# Patient Record
Sex: Female | Born: 1935 | Race: White | Hispanic: No | Smoking: Former smoker
Health system: Southern US, Community
[De-identification: ages and names within clinical notes are randomized; demographics above are authoritative.]

## PROBLEM LIST (undated history)

## (undated) DIAGNOSIS — I4891 Unspecified atrial fibrillation: Secondary | ICD-10-CM

## (undated) DIAGNOSIS — I10 Essential (primary) hypertension: Secondary | ICD-10-CM

## (undated) DIAGNOSIS — F419 Anxiety disorder, unspecified: Secondary | ICD-10-CM

## (undated) DIAGNOSIS — K2970 Gastritis, unspecified, without bleeding: Secondary | ICD-10-CM

## (undated) DIAGNOSIS — J45909 Unspecified asthma, uncomplicated: Secondary | ICD-10-CM

## (undated) DIAGNOSIS — M1990 Unspecified osteoarthritis, unspecified site: Secondary | ICD-10-CM

## (undated) HISTORY — PX: ABDOMINAL HYSTERECTOMY: SHX81

---

## 2008-01-23 ENCOUNTER — Ambulatory Visit: Payer: Self-pay | Admitting: Unknown Physician Specialty

## 2008-04-19 ENCOUNTER — Emergency Department: Payer: Self-pay | Admitting: Emergency Medicine

## 2013-08-01 ENCOUNTER — Emergency Department: Payer: Self-pay | Admitting: Emergency Medicine

## 2014-06-12 ENCOUNTER — Emergency Department: Payer: Self-pay | Admitting: Emergency Medicine

## 2016-09-27 DIAGNOSIS — I10 Essential (primary) hypertension: Secondary | ICD-10-CM | POA: Diagnosis not present

## 2016-09-27 DIAGNOSIS — M159 Polyosteoarthritis, unspecified: Secondary | ICD-10-CM | POA: Diagnosis not present

## 2016-09-27 DIAGNOSIS — H1011 Acute atopic conjunctivitis, right eye: Secondary | ICD-10-CM | POA: Diagnosis not present

## 2016-09-27 DIAGNOSIS — J438 Other emphysema: Secondary | ICD-10-CM | POA: Diagnosis not present

## 2016-09-27 DIAGNOSIS — K219 Gastro-esophageal reflux disease without esophagitis: Secondary | ICD-10-CM | POA: Diagnosis not present

## 2016-09-27 DIAGNOSIS — F39 Unspecified mood [affective] disorder: Secondary | ICD-10-CM | POA: Diagnosis not present

## 2016-10-16 DIAGNOSIS — M159 Polyosteoarthritis, unspecified: Secondary | ICD-10-CM

## 2016-10-16 DIAGNOSIS — J438 Other emphysema: Secondary | ICD-10-CM | POA: Diagnosis not present

## 2016-10-16 DIAGNOSIS — K219 Gastro-esophageal reflux disease without esophagitis: Secondary | ICD-10-CM

## 2016-10-16 DIAGNOSIS — F39 Unspecified mood [affective] disorder: Secondary | ICD-10-CM

## 2016-10-16 DIAGNOSIS — I10 Essential (primary) hypertension: Secondary | ICD-10-CM

## 2016-11-03 ENCOUNTER — Emergency Department: Payer: Medicare Other

## 2016-11-03 ENCOUNTER — Encounter: Payer: Self-pay | Admitting: Emergency Medicine

## 2016-11-03 ENCOUNTER — Observation Stay
Admission: EM | Admit: 2016-11-03 | Discharge: 2016-11-06 | Disposition: A | Discharge status: Skilled Nursing Facility | Payer: Medicare Other | Attending: Internal Medicine | Admitting: Internal Medicine

## 2016-11-03 DIAGNOSIS — I119 Hypertensive heart disease without heart failure: Secondary | ICD-10-CM | POA: Diagnosis not present

## 2016-11-03 DIAGNOSIS — I10 Essential (primary) hypertension: Secondary | ICD-10-CM | POA: Diagnosis present

## 2016-11-03 DIAGNOSIS — S3210XA Unspecified fracture of sacrum, initial encounter for closed fracture: Secondary | ICD-10-CM | POA: Diagnosis not present

## 2016-11-03 DIAGNOSIS — M7072 Other bursitis of hip, left hip: Secondary | ICD-10-CM | POA: Insufficient documentation

## 2016-11-03 DIAGNOSIS — K219 Gastro-esophageal reflux disease without esophagitis: Secondary | ICD-10-CM | POA: Diagnosis not present

## 2016-11-03 DIAGNOSIS — J441 Chronic obstructive pulmonary disease with (acute) exacerbation: Principal | ICD-10-CM | POA: Insufficient documentation

## 2016-11-03 DIAGNOSIS — Z7951 Long term (current) use of inhaled steroids: Secondary | ICD-10-CM | POA: Insufficient documentation

## 2016-11-03 DIAGNOSIS — X58XXXA Exposure to other specified factors, initial encounter: Secondary | ICD-10-CM | POA: Diagnosis not present

## 2016-11-03 DIAGNOSIS — Z79899 Other long term (current) drug therapy: Secondary | ICD-10-CM | POA: Diagnosis not present

## 2016-11-03 DIAGNOSIS — Z66 Do not resuscitate: Secondary | ICD-10-CM | POA: Diagnosis not present

## 2016-11-03 DIAGNOSIS — Z87891 Personal history of nicotine dependence: Secondary | ICD-10-CM | POA: Insufficient documentation

## 2016-11-03 DIAGNOSIS — F419 Anxiety disorder, unspecified: Secondary | ICD-10-CM | POA: Diagnosis present

## 2016-11-03 DIAGNOSIS — D72829 Elevated white blood cell count, unspecified: Secondary | ICD-10-CM | POA: Diagnosis present

## 2016-11-03 DIAGNOSIS — S76092A Other specified injury of muscle, fascia and tendon of left hip, initial encounter: Secondary | ICD-10-CM | POA: Insufficient documentation

## 2016-11-03 DIAGNOSIS — R7989 Other specified abnormal findings of blood chemistry: Secondary | ICD-10-CM

## 2016-11-03 DIAGNOSIS — M25452 Effusion, left hip: Secondary | ICD-10-CM | POA: Diagnosis present

## 2016-11-03 DIAGNOSIS — Z7982 Long term (current) use of aspirin: Secondary | ICD-10-CM | POA: Diagnosis not present

## 2016-11-03 DIAGNOSIS — R778 Other specified abnormalities of plasma proteins: Secondary | ICD-10-CM

## 2016-11-03 DIAGNOSIS — R531 Weakness: Secondary | ICD-10-CM

## 2016-11-03 DIAGNOSIS — M25552 Pain in left hip: Secondary | ICD-10-CM | POA: Diagnosis present

## 2016-11-03 DIAGNOSIS — L8990 Pressure ulcer of unspecified site, unspecified stage: Secondary | ICD-10-CM | POA: Insufficient documentation

## 2016-11-03 DIAGNOSIS — I4891 Unspecified atrial fibrillation: Secondary | ICD-10-CM | POA: Insufficient documentation

## 2016-11-03 DIAGNOSIS — M2540 Effusion, unspecified joint: Secondary | ICD-10-CM

## 2016-11-03 HISTORY — DX: Essential (primary) hypertension: I10

## 2016-11-03 HISTORY — DX: Unspecified atrial fibrillation: I48.91

## 2016-11-03 HISTORY — DX: Anxiety disorder, unspecified: F41.9

## 2016-11-03 HISTORY — DX: Unspecified asthma, uncomplicated: J45.909

## 2016-11-03 HISTORY — DX: Unspecified osteoarthritis, unspecified site: M19.90

## 2016-11-03 HISTORY — DX: Gastritis, unspecified, without bleeding: K29.70

## 2016-11-03 LAB — COMPREHENSIVE METABOLIC PANEL
ALK PHOS: 77 U/L (ref 38–126)
ALT: 18 U/L (ref 14–54)
AST: 45 U/L — AB (ref 15–41)
Albumin: 3.6 g/dL (ref 3.5–5.0)
Anion gap: 9 (ref 5–15)
BUN: 33 mg/dL — AB (ref 6–20)
CALCIUM: 8.7 mg/dL — AB (ref 8.9–10.3)
CHLORIDE: 98 mmol/L — AB (ref 101–111)
CO2: 26 mmol/L (ref 22–32)
CREATININE: 1.28 mg/dL — AB (ref 0.44–1.00)
GFR calc Af Amer: 39 mL/min — ABNORMAL LOW (ref >60)
GFR, EST NON AFRICAN AMERICAN: 34 mL/min — AB (ref >60)
Glucose, Bld: 124 mg/dL — ABNORMAL HIGH (ref 65–99)
Potassium: 4.8 mmol/L (ref 3.5–5.1)
Sodium: 133 mmol/L — ABNORMAL LOW (ref 135–145)
Total Bilirubin: 1.3 mg/dL — ABNORMAL HIGH (ref 0.3–1.2)
Total Protein: 7.4 g/dL (ref 6.5–8.1)

## 2016-11-03 LAB — CBC WITH DIFFERENTIAL/PLATELET
BASOS PCT: 0 %
Basophils Absolute: 0.1 10*3/uL (ref 0–0.1)
EOS ABS: 0.1 10*3/uL (ref 0–0.7)
Eosinophils Relative: 0 %
HCT: 30.4 % — ABNORMAL LOW (ref 35.0–47.0)
Hemoglobin: 10.3 g/dL — ABNORMAL LOW (ref 12.0–16.0)
LYMPHS ABS: 1.2 10*3/uL (ref 1.0–3.6)
Lymphocytes Relative: 7 %
MCH: 29.3 pg (ref 26.0–34.0)
MCHC: 33.7 g/dL (ref 32.0–36.0)
MCV: 87 fL (ref 80.0–100.0)
MONOS PCT: 5 %
Monocytes Absolute: 0.9 10*3/uL (ref 0.2–0.9)
Neutro Abs: 15.3 10*3/uL — ABNORMAL HIGH (ref 1.4–6.5)
Neutrophils Relative %: 88 %
PLATELETS: 338 10*3/uL (ref 150–440)
RBC: 3.5 MIL/uL — ABNORMAL LOW (ref 3.80–5.20)
RDW: 15.4 % — ABNORMAL HIGH (ref 11.5–14.5)
WBC: 17.6 10*3/uL — AB (ref 3.6–11.0)

## 2016-11-03 LAB — TROPONIN I
TROPONIN I: 0.03 ng/mL — AB (ref <0.03)
TROPONIN I: 0.03 ng/mL — AB (ref <0.03)

## 2016-11-03 LAB — URINALYSIS, COMPLETE (UACMP) WITH MICROSCOPIC
Bacteria, UA: NONE SEEN
Bilirubin Urine: NEGATIVE
Glucose, UA: NEGATIVE mg/dL
Hgb urine dipstick: NEGATIVE
Ketones, ur: NEGATIVE mg/dL
Leukocytes, UA: NEGATIVE
Nitrite: NEGATIVE
PH: 6 (ref 5.0–8.0)
Protein, ur: NEGATIVE mg/dL
SPECIFIC GRAVITY, URINE: 1.014 (ref 1.005–1.030)
SQUAMOUS EPITHELIAL / LPF: NONE SEEN

## 2016-11-03 LAB — PROTIME-INR
INR: 0.97
Prothrombin Time: 12.9 seconds (ref 11.4–15.2)

## 2016-11-03 LAB — SEDIMENTATION RATE: SED RATE: 67 mm/h — AB (ref 0–30)

## 2016-11-03 MED ORDER — VANCOMYCIN HCL IN DEXTROSE 1-5 GM/200ML-% IV SOLN
1000.0000 mg | Freq: Once | INTRAVENOUS | Status: DC
  Filled 2016-11-03 (×2): qty 200

## 2016-11-03 MED ORDER — PIPERACILLIN-TAZOBACTAM 3.375 G IVPB
3.3750 g | Freq: Once | INTRAVENOUS | Status: AC
  Administered 2016-11-04: 3.375 g via INTRAVENOUS
  Filled 2016-11-03: qty 50

## 2016-11-03 NOTE — ED Triage Notes (Signed)
Pt. Here from [facility] for lt. Hip pain.  Pt. Denies injury.

## 2016-11-03 NOTE — ED Provider Notes (Signed)
IMPRESSION: 1. No acute fracture or malalignment 2. Suspect small left hip effusion. Edema and inflammatory change within the soft tissues of the anterior compartment of the left hip. Findings could be secondary to infectious or inflammatory process. MRI recommended for further evaluation.  Patient with small hip effusion and leukocytosis concerning for possible septic hip. Clinical exam is not consistent with septic arthritis but this must be considered. She still cannot ambulate, I will discuss with the hospitalist for admission and possible ultrasound guided drainage of the left hip.    Michele FilbertJonathan E Williams, MD 11/03/16 [phone]

## 2016-11-03 NOTE — ED Provider Notes (Signed)
[facility] Emergency Department Provider Note  ____________________________________________   First MD Initiated Contact with Patient 11/03/16 1916     (approximate)  I have reviewed the triage vital signs and the nursing notes.   HISTORY  Chief Complaint Hip Pain (Pt. here via EMS from [facility].)    HPI Michele Cooper is a 83 y.o. female who comes to the emergency department with 4 days of atraumatic left hip pain. She can normally ambulate with a walker but for the last 4 days has been unable to bear weight. She reports no fall. She denies numbness or weakness. She denies chest pain or shortness of breath. She denies abdominal pain nausea or vomiting. She arrives via EMS from [facility] [place] with her DNR/DNI. It is aching mild nonradiating. Worse with bearing weight or movement and improved with rest.   Past Medical History:  Diagnosis Date  . A-fib (HCC)   . Anxiety   . Arthritis   . Gastritis   . Hypertension   . Reactive airway disease     There are no active problems to display for this patient.   Past Surgical History:  Procedure Laterality Date  . ABDOMINAL HYSTERECTOMY      Prior to Admission medications   Not on File    Allergies Sulfa antibiotics  No family history on file.  Social History Social History  Substance Use Topics  . Smoking status: Former Smoker    Types: Cigarettes  . Smokeless tobacco: Former Neurosurgeon  . Alcohol use Yes    Review of Systems Constitutional: No fever/chills Eyes: No visual changes. ENT: No sore throat. Cardiovascular: Denies chest pain. Respiratory: Denies shortness of breath. Gastrointestinal: No abdominal pain.  No nausea, no vomiting.  No diarrhea.  No constipation. Genitourinary: Negative for dysuria. Musculoskeletal: Positive for hip pain and difficulty ambulating Skin: Negative for rash. Neurological: Negative for headaches, focal weakness or numbness.  10-point ROS otherwise  negative.  ____________________________________________   PHYSICAL EXAM:  VITAL SIGNS: ED Triage Vitals  Enc Vitals Group     BP 11/03/16 1912 (!) 151/91     Pulse Rate 11/03/16 1912 95     Resp 11/03/16 1912 18     Temp 11/03/16 1912 98.3 F (36.8 C)     Temp Source 11/03/16 1912 Oral     SpO2 11/03/16 1909 100 %     Weight 11/03/16 1914 130 lb (59 kg)     Height 11/03/16 1914 4\' 11"  (1.499 m)     Head Circumference --      Peak Flow --      Pain Score --      Pain Loc --      Pain Edu? --      Excl. in GC? --     Constitutional: Alert and oriented 4 pleasant cooperative speaks in full clear sentences no diaphoresis Eyes: Conjunctivae are normal. PERRL. EOMI. Head: Atraumatic. Nose: No congestion/rhinnorhea. Mouth/Throat: Mucous membranes are moist.  Oropharynx non-erythematous. Neck: No stridor.   Cardiovascular: Normal rate, regular rhythm. Grossly normal heart sounds.  Good peripheral circulation. Respiratory: Normal respiratory effort.  No retractions. Lungs CTAB. Gastrointestinal: Soft nondistended nontender no rebound no guarding no peritonitis no pulsatile masses. Musculoskeletal: No midline back tenderness. Able to fully range her right hip with no difficulty. Able to range left hip but significant discomfort with internal and external rotation Neurologic:  Normal speech and language. No gross focal neurologic deficits are appreciated. No gait instability. Skin:  Skin is warm, dry and intact. No rash noted. Psychiatric: Mood and affect are normal. Speech and behavior are normal.  ____________________________________________   LABS (all labs ordered are listed, but only abnormal results are displayed)  Labs Reviewed  COMPREHENSIVE METABOLIC PANEL - Abnormal; Notable for the following:       Result Value   Sodium 133 (*)    Chloride 98 (*)    Glucose, Bld 124 (*)    BUN 33 (*)    Creatinine, Ser 1.28 (*)    Calcium 8.7 (*)    AST 45 (*)    Total  Bilirubin 1.3 (*)    GFR calc non Af Amer 34 (*)    GFR calc Af Amer 39 (*)    All other components within normal limits  CBC WITH DIFFERENTIAL/PLATELET - Abnormal; Notable for the following:    WBC 17.6 (*)    RBC 3.50 (*)    Hemoglobin 10.3 (*)    HCT 30.4 (*)    RDW 15.4 (*)    Neutro Abs 15.3 (*)    All other components within normal limits  TROPONIN I - Abnormal; Notable for the following:    Troponin I 0.03 (*)    All other components within normal limits  PROTIME-INR  URINALYSIS, COMPLETE (UACMP) WITH MICROSCOPIC   ______slightly elevated trop______________________________________  EKG  ED ECG REPORT I, Merrily BrittleNeil Cuong Moorman, the attending physician, personally viewed and interpreted this ECG.  Date: 11/03/2016 Rate: 95 Rhythm: normal sinus rhythm QRS Axis: normal Intervals: normal ST/T Wave abnormalities: normal Conduction Disturbances: none Narrative Interpretation: unremarkable   ____________________________________________  RADIOLOGY  XR pending ____________________________________________   PROCEDURES  Procedure(s) performed: no  Procedures  Critical Care performed: no  ____________________________________________   INITIAL IMPRESSION / ASSESSMENT AND PLAN / ED COURSE  Pertinent labs & imaging results that were available during my care of the patient were reviewed by me and considered in my medical decision making (see chart for details).  On arrival the patient is hemodynamically stable, but with significant tenderness in L hip concerning for fracture.  As she's unable to bear weight, if the XR is negative she'll require inpatient admission regardless for advanced imaging and PT evaluation.     ____________________________________________   FINAL CLINICAL IMPRESSION(S) / ED DIAGNOSES  Final diagnoses:  None      NEW MEDICATIONS STARTED DURING THIS VISIT:  New Prescriptions   No medications on file     Note:  This document was  prepared using Dragon voice recognition software and may include unintentional dictation errors.     Merrily BrittleNeil Kerem Gilmer, MD 11/03/16 2044

## 2016-11-04 ENCOUNTER — Encounter: Payer: Self-pay | Admitting: Internal Medicine

## 2016-11-04 DIAGNOSIS — D72829 Elevated white blood cell count, unspecified: Secondary | ICD-10-CM | POA: Diagnosis present

## 2016-11-04 DIAGNOSIS — F419 Anxiety disorder, unspecified: Secondary | ICD-10-CM | POA: Diagnosis present

## 2016-11-04 DIAGNOSIS — J441 Chronic obstructive pulmonary disease with (acute) exacerbation: Secondary | ICD-10-CM | POA: Diagnosis not present

## 2016-11-04 DIAGNOSIS — L8990 Pressure ulcer of unspecified site, unspecified stage: Secondary | ICD-10-CM | POA: Insufficient documentation

## 2016-11-04 DIAGNOSIS — M25452 Effusion, left hip: Secondary | ICD-10-CM | POA: Diagnosis present

## 2016-11-04 DIAGNOSIS — I10 Essential (primary) hypertension: Secondary | ICD-10-CM | POA: Diagnosis present

## 2016-11-04 LAB — BASIC METABOLIC PANEL
ANION GAP: 8 (ref 5–15)
BUN: 31 mg/dL — ABNORMAL HIGH (ref 6–20)
CALCIUM: 8.5 mg/dL — AB (ref 8.9–10.3)
CHLORIDE: 102 mmol/L (ref 101–111)
CO2: 28 mmol/L (ref 22–32)
CREATININE: 1.18 mg/dL — AB (ref 0.44–1.00)
GFR calc Af Amer: 43 mL/min — ABNORMAL LOW (ref >60)
GFR calc non Af Amer: 37 mL/min — ABNORMAL LOW (ref >60)
Glucose, Bld: 107 mg/dL — ABNORMAL HIGH (ref 65–99)
Potassium: 3.8 mmol/L (ref 3.5–5.1)
Sodium: 138 mmol/L (ref 135–145)

## 2016-11-04 LAB — URINALYSIS, ROUTINE W REFLEX MICROSCOPIC
Bilirubin Urine: NEGATIVE
GLUCOSE, UA: NEGATIVE mg/dL
Hgb urine dipstick: NEGATIVE
Ketones, ur: NEGATIVE mg/dL
LEUKOCYTES UA: NEGATIVE
Nitrite: NEGATIVE
PH: 5 (ref 5.0–8.0)
Protein, ur: NEGATIVE mg/dL
Specific Gravity, Urine: 1.018 (ref 1.005–1.030)

## 2016-11-04 LAB — CBC
HCT: 28 % — ABNORMAL LOW (ref 35.0–47.0)
HEMOGLOBIN: 9.3 g/dL — AB (ref 12.0–16.0)
MCH: 29.9 pg (ref 26.0–34.0)
MCHC: 33.3 g/dL (ref 32.0–36.0)
MCV: 89.8 fL (ref 80.0–100.0)
Platelets: 304 10*3/uL (ref 150–440)
RBC: 3.12 MIL/uL — ABNORMAL LOW (ref 3.80–5.20)
RDW: 14.8 % — ABNORMAL HIGH (ref 11.5–14.5)
WBC: 12.8 10*3/uL — ABNORMAL HIGH (ref 3.6–11.0)

## 2016-11-04 LAB — SEDIMENTATION RATE: SED RATE: 72 mm/h — AB (ref 0–30)

## 2016-11-04 LAB — C-REACTIVE PROTEIN: CRP: 10.6 mg/dL — ABNORMAL HIGH (ref <1.0)

## 2016-11-04 MED ORDER — BUDESONIDE 0.5 MG/2ML IN SUSP
0.5000 mg | Freq: Two times a day (BID) | RESPIRATORY_TRACT | Status: DC
  Administered 2016-11-04 – 2016-11-06 (×5): 0.5 mg via RESPIRATORY_TRACT
  Filled 2016-11-04 (×5): qty 2

## 2016-11-04 MED ORDER — ACETAMINOPHEN 650 MG RE SUPP: 650.0000 mg | Freq: Four times a day (QID) | RECTAL | Status: DC | PRN

## 2016-11-04 MED ORDER — PIPERACILLIN-TAZOBACTAM 3.375 G IVPB: 3.3750 g | Freq: Two times a day (BID) | INTRAVENOUS | Status: DC

## 2016-11-04 MED ORDER — PANTOPRAZOLE SODIUM 40 MG PO TBEC
40.0000 mg | DELAYED_RELEASE_TABLET | Freq: Two times a day (BID) | ORAL | Status: DC
  Administered 2016-11-04 – 2016-11-06 (×5): 40 mg via ORAL
  Filled 2016-11-04 (×5): qty 1

## 2016-11-04 MED ORDER — PREDNISOLONE 5 MG PO TABS: 20.0000 mg | ORAL_TABLET | Freq: Every day | ORAL | Status: DC

## 2016-11-04 MED ORDER — VANCOMYCIN HCL IN DEXTROSE 750-5 MG/150ML-% IV SOLN: 750.0000 mg | INTRAVENOUS | Status: DC

## 2016-11-04 MED ORDER — ONDANSETRON HCL 4 MG PO TABS
4.0000 mg | ORAL_TABLET | Freq: Four times a day (QID) | ORAL | Status: DC | PRN
Start: 2016-11-04 — End: 2016-11-06

## 2016-11-04 MED ORDER — IPRATROPIUM-ALBUTEROL 0.5-2.5 (3) MG/3ML IN SOLN
3.0000 mL | Freq: Four times a day (QID) | RESPIRATORY_TRACT | Status: DC
  Administered 2016-11-04 – 2016-11-06 (×8): 3 mL via RESPIRATORY_TRACT
  Filled 2016-11-04 (×10): qty 3

## 2016-11-04 MED ORDER — ACETAMINOPHEN 325 MG PO TABS: 650.0000 mg | ORAL_TABLET | Freq: Four times a day (QID) | ORAL | Status: DC | PRN

## 2016-11-04 MED ORDER — MONTELUKAST SODIUM 10 MG PO TABS
10.0000 mg | ORAL_TABLET | Freq: Every day | ORAL | Status: DC
  Administered 2016-11-04 – 2016-11-05 (×2): 10 mg via ORAL
  Filled 2016-11-04 (×2): qty 1

## 2016-11-04 MED ORDER — ASPIRIN EC 81 MG PO TBEC
81.0000 mg | DELAYED_RELEASE_TABLET | Freq: Every day | ORAL | Status: DC
  Administered 2016-11-04 – 2016-11-06 (×3): 81 mg via ORAL
  Filled 2016-11-04 (×3): qty 1

## 2016-11-04 MED ORDER — HYDROXYZINE HCL 25 MG PO TABS
25.0000 mg | ORAL_TABLET | Freq: Three times a day (TID) | ORAL | Status: DC | PRN
  Filled 2016-11-04: qty 1

## 2016-11-04 MED ORDER — ENOXAPARIN SODIUM 30 MG/0.3ML ~~LOC~~ SOLN
30.0000 mg | SUBCUTANEOUS | Status: DC
  Administered 2016-11-04 – 2016-11-05 (×2): 30 mg via SUBCUTANEOUS
  Filled 2016-11-04 (×2): qty 0.3

## 2016-11-04 MED ORDER — DEXTROSE 5 % IV SOLN: 1.0000 g | INTRAVENOUS | Status: DC

## 2016-11-04 MED ORDER — PROPRANOLOL HCL 10 MG PO TABS
20.0000 mg | ORAL_TABLET | Freq: Every day | ORAL | Status: DC
  Administered 2016-11-04 – 2016-11-06 (×3): 20 mg via ORAL
  Filled 2016-11-04 (×3): qty 2

## 2016-11-04 MED ORDER — ONDANSETRON HCL 4 MG/2ML IJ SOLN
4.0000 mg | Freq: Four times a day (QID) | INTRAMUSCULAR | Status: DC | PRN
  Filled 2016-11-04: qty 2

## 2016-11-04 MED ORDER — TRAMADOL HCL 50 MG PO TABS
50.0000 mg | ORAL_TABLET | Freq: Four times a day (QID) | ORAL | Status: DC | PRN
  Administered 2016-11-04 – 2016-11-05 (×3): 50 mg via ORAL
  Filled 2016-11-04 (×3): qty 1

## 2016-11-04 MED ORDER — PIPERACILLIN-TAZOBACTAM 3.375 G IVPB: 3.3750 g | Freq: Three times a day (TID) | INTRAVENOUS | Status: DC

## 2016-11-04 MED ORDER — ENOXAPARIN SODIUM 40 MG/0.4ML ~~LOC~~ SOLN: 40.0000 mg | SUBCUTANEOUS | Status: DC

## 2016-11-04 MED ORDER — PREDNISONE 20 MG PO TABS
20.0000 mg | ORAL_TABLET | Freq: Every day | ORAL | Status: DC
  Administered 2016-11-04 – 2016-11-06 (×3): 20 mg via ORAL
  Filled 2016-11-04 (×3): qty 1

## 2016-11-04 MED ORDER — DEXTROSE 5 % IV SOLN
1.0000 g | INTRAVENOUS | Status: DC
  Administered 2016-11-04 – 2016-11-05 (×2): 1 g via INTRAVENOUS
  Filled 2016-11-04 (×2): qty 10

## 2016-11-04 MED ORDER — AZITHROMYCIN 250 MG PO TABS
250.0000 mg | ORAL_TABLET | Freq: Every day | ORAL | Status: DC
  Administered 2016-11-05 – 2016-11-06 (×2): 250 mg via ORAL
  Filled 2016-11-04 (×2): qty 1

## 2016-11-04 MED ORDER — CITALOPRAM HYDROBROMIDE 20 MG PO TABS
20.0000 mg | ORAL_TABLET | Freq: Every day | ORAL | Status: DC
  Administered 2016-11-04 – 2016-11-06 (×3): 20 mg via ORAL
  Filled 2016-11-04 (×3): qty 1

## 2016-11-04 MED ORDER — AZITHROMYCIN 500 MG PO TABS
500.0000 mg | ORAL_TABLET | Freq: Every day | ORAL | Status: AC
  Administered 2016-11-04: 500 mg via ORAL
  Filled 2016-11-04: qty 1

## 2016-11-04 NOTE — Progress Notes (Signed)
Lovenox changed to 30 mg daily for BMI <40 and CrCl <30 

## 2016-11-04 NOTE — Clinical Social Work Note (Signed)
Via chart review, CSW is aware that the patient is here from [facility]. CSW will follow pending PT recommendations.   Michele PonderKaren Martha Domique Cooper, MSW, Theresia MajorsLCSWA [phone]

## 2016-11-04 NOTE — ED Notes (Signed)
Patient transported to room 151

## 2016-11-04 NOTE — Evaluation (Signed)
Physical Therapy Evaluation Patient Details Name: Michele Cooper MRN: [id number] DOB: 20-Mar-1916 Today's Date: 11/04/2016   History of Present Illness  Michele Cooper  is a 83 y.o. female who presents with left hip pain. Patient states that her hip began bothering her about 3-4 days ago. She reached a point where she was unable to bear weight on it safely. She states she did not fall or sustain any trauma to it. Here in the ED imaging shows no fracture but does show an effusion. Patient also has leukocytosis on laboratory evaluation. She was given a dose of antibiotics in the ED and Hospitalists were called for admission and further evaluation. Pt has been evaluated by ortho who recommended an MRI. She was cleared for mobility and is WBAT on LLE.   Clinical Impression  Pt admitted with above diagnosis. Pt currently with functional limitations due to the deficits listed below (see PT Problem List).  Pt with considerable L hip pain and weakness which limits her bed mobility, transfers, and ambulation. She is only able to take a few small steps forward/backward at EOB but with increased pain, weakness, and instability. She requires modA+1 assist for bed mobility as well. Pt lives alone in independent living apartment at [facility]. She would benefit from SNF placement for safety and to work with PT on strengthening and mobility. If she has problems with payor for SNF she may be able to use some respite days at [facility] or obtain 24/7 care with [facility] PT at home. SNF placement would be ideal. Pt will benefit from skilled PT services to address deficits in strength, balance, and mobility in order to return to full function at home.     Follow Up Recommendations SNF    Equipment Recommendations  None recommended by PT    Recommendations for Other Services       Precautions / Restrictions Precautions Precautions: None Restrictions Weight Bearing Restrictions: Yes LLE Weight Bearing: Weight bearing as  tolerated      Mobility  Bed Mobility Overal bed mobility: Needs Assistance Bed Mobility: Supine to Sit;Sit to Supine     Supine to sit: Mod assist Sit to supine: Mod assist;+2 for physical assistance   General bed mobility comments: Pt requires verbal and tactile cues for sequencing. Assist for LLE secondary to pain and weakness. Pt requires trunk support and LLE assist when returning to bed as well as +2 assist to scoot up toward [facility]  Transfers Overall transfer level: Needs assistance Equipment used: Rolling walker (2 wheeled) Transfers: Sit to/from Stand Sit to Stand: Min assist;+2 physical assistance         General transfer comment: Pt requires minA+2 to come to standing. She requires increased time and decreased weight shift to LLE noted. Once upright she is tremulous but able to support herself with bilateral UEs on rolling walker  Ambulation/Gait Ambulation/Gait assistance: Min assist;+2 physical assistance Ambulation Distance (Feet): 5 Feet Assistive device: Rolling walker (2 wheeled) Gait Pattern/deviations: Decreased step length - right;Decreased step length - left Gait velocity: Decreased Gait velocity interpretation: <1.8 ft/sec, indicative of risk for recurrent falls General Gait Details: Pt is able to take short shuffling steps forward/backward and to the L at EOB. She demonstrates decreased weight shifting to LLE during ambulation and is unsteady. She requires continual support for balance as well as LLE. She has difficulty advancing LLE during ambulation as well as poor safety awareness with posterior leaning when stepping backwards toward bed  Stairs  Wheelchair Mobility    Modified Rankin (Stroke Patients Only)       Balance Overall balance assessment: Needs assistance Sitting-balance support: No upper extremity supported Sitting balance-Leahy Scale: Fair     Standing balance support: Bilateral upper extremity supported Standing  balance-Leahy Scale: Poor Standing balance comment: Requires bilateral UE support on rolling walker to remain upright                             Pertinent Vitals/Pain Pain Assessment: No/denies pain    Home Living Family/patient expects to be discharged to:: Private residence Living Arrangements: Alone Available Help at Discharge: Personal care attendant;Available PRN/intermittently (5h/day, 5d/wk) Type of Home: Other(Comment) ([place] [facility] apartment, ground floor) Home Access: Level entry     Home Layout: One level Home Equipment: Environmental consultant - 4 wheels;Wheelchair - manual;Cane - single point;Other (comment) (Doesn't use the shower, aids assist with bed bath)      Prior Function Level of Independence: Needs assistance   Gait / Transfers Assistance Needed: Ambulates with rollator limited distances  ADL's / Homemaking Assistance Needed: Assist from [facility] aids with ADLs/IADLs. Makes frozen meals or orders meals from facility        Hand Dominance   Dominant Hand: Right    Extremity/Trunk Assessment   Upper Extremity Assessment Upper Extremity Assessment: RUE deficits/detail RUE Deficits / Details: Pt with bilateral shoulder flexion weakness which is chronic. Elbow flex/ext and grip strength WFL    Lower Extremity Assessment Lower Extremity Assessment: LLE deficits/detail LLE Deficits / Details: RLE strength grossly WFL. Pt unable to perform L SLR secondary to weakness/pain. Difficulty accepting weight onto LLE in standing as well as advancing LLE secondary to pain and weakness       Communication   Communication: No difficulties  Cognition Arousal/Alertness: Awake/alert Behavior During Therapy: WFL for tasks assessed/performed Overall Cognitive Status: Within Functional Limits for tasks assessed                      General Comments      Exercises     Assessment/Plan    PT Assessment Patient needs continued PT services  PT Problem List  Decreased strength;Decreased balance;Decreased mobility;Decreased range of motion;Decreased activity tolerance;Pain       PT Treatment Interventions DME instruction;Gait training;Therapeutic exercise;Therapeutic activities;Balance training;Neuromuscular re-education;Patient/family education;Manual techniques    PT Goals (Current goals can be found in the Care Plan section)  Acute Rehab PT Goals Patient Stated Goal: Return to prior level of function at her apartment PT Goal Formulation: With patient Time For Goal Achievement: 11/18/16 Potential to Achieve Goals: Good    Frequency 7X/week   Barriers to discharge Decreased caregiver support Lives alone    Co-evaluation               End of Session Equipment Utilized During Treatment: Gait belt Activity Tolerance: Patient tolerated treatment well Patient left: in bed;with call bell/phone within reach;with bed alarm set   PT Visit Diagnosis: Unsteadiness on feet (R26.81);Muscle weakness (generalized) (M62.81);Pain Pain - Right/Left: Left Pain - part of body: Hip    Functional Assessment Tool Used: AM-PAC 6 Clicks Basic Mobility;Clinical judgement Functional Limitation: Mobility: Walking and moving around Mobility: Walking and Moving Around Current Status (Z6109): At least 60 percent but less than 80 percent impaired, limited or restricted Mobility: Walking and Moving Around Goal Status [phone]): At least 20 percent but less than 40 percent impaired, limited or restricted  Time: [id number] PT Time Calculation (min) (ACUTE ONLY): 23 min   Charges:   PT Evaluation $PT Eval Moderate Complexity: 1 Procedure     PT G Codes:   PT G-Codes **NOT FOR INPATIENT CLASS** Functional Assessment Tool Used: AM-PAC 6 Clicks Basic Mobility;Clinical judgement Functional Limitation: Mobility: Walking and moving around Mobility: Walking and Moving Around Current Status (V4098(G8978): At least 60 percent but less than 80 percent impaired, limited  or restricted Mobility: Walking and Moving Around Goal Status [phone](G8979): At least 20 percent but less than 40 percent impaired, limited or restricted    Lynnea MaizesJason D Huprich PT, DPT   Huprich,Jason 11/04/2016, 1:24 PM

## 2016-11-04 NOTE — Progress Notes (Addendum)
Pharmacy Antibiotic Note  Cora Danielslizabeth L Gargan is a 83 y.o. female admitted on 11/03/2016 with hip effusion with leukocytosis.  Pharmacy has been consulted for vancomycin and Zosyn dosing.  Plan: DW 50kg  Vd 35L kei 0.02 hr-1  T1/2 35 hours Vancomycin 750 mg q 48 hours ordered with stacked dosing. Level before 3rd dose. Goal trough 15-20.   Zosyn 3.375 grams q 8 hours ordered.Marland Kitchen.   Height: 4\' 11"  (149.9 cm) Weight: 130 lb (59 kg) IBW/kg (Calculated) : 43.2  Temp (24hrs), Avg:98.5 F (36.9 C), Min:98.2 F (36.8 C), Max:99 F (37.2 C)   Recent Labs Lab 11/03/16 1939  WBC 17.6*  CREATININE 1.28*    Estimated Creatinine Clearance: 19.2 mL/min (by C-G formula based on SCr of 1.28 mg/dL (H)).    Allergies  Allergen Reactions  . Sulfa Antibiotics Hives    Antimicrobials this admission: Vancomycin  Zosyn 2/25 >>    >>   Dose adjustments this admission:   Microbiology results: 2/24 body fluid cx: pending  Thank you for allowing pharmacy to be a part of this patient's care.  Denson Niccoli S 11/04/2016 3:53 AM

## 2016-11-04 NOTE — Progress Notes (Signed)
New orders for urine culture and UA. Will continue to monitor

## 2016-11-04 NOTE — Progress Notes (Signed)
Patient with increased confusion, warm to touch. Oral temp 97.4. Foul urine odor. Paged MD to make aware. No orders at this time.

## 2016-11-04 NOTE — H&P (Signed)
[facility] Physicians - [place] at [facility]   PATIENT NAME: Michele Cooper    MR#:  [id number]  DATE OF BIRTH:  Jun 16, 1916  DATE OF ADMISSION:  11/03/2016  PRIMARY CARE PHYSICIAN: No PCP Per Patient   REQUESTING/REFERRING PHYSICIAN: Mayford Knife, MD  CHIEF COMPLAINT:   Chief Complaint  Patient presents with  . Hip Pain    Pt. here via EMS from [facility].    HISTORY OF PRESENT ILLNESS:  Michele Cooper  is a 83 y.o. female who presents with Left hip pain. Patient states that her hip began bothering her about 3-4 days ago. She reached a point where she was unable to bear weight on it safely. She states she did not fall or sustain any trauma to it. Here in the ED imaging shows no fracture but does show an effusion. Patient also has leukocytosis on laboratory evaluation. She was given a dose of antibiotics in the ED and Hospitalists were called for admission and further evaluation.  PAST MEDICAL HISTORY:   Past Medical History:  Diagnosis Date  . A-fib (HCC)   . Anxiety   . Arthritis   . Gastritis   . Hypertension   . Reactive airway disease     PAST SURGICAL HISTORY:   Past Surgical History:  Procedure Laterality Date  . ABDOMINAL HYSTERECTOMY      SOCIAL HISTORY:   Social History  Substance Use Topics  . Smoking status: Former Smoker    Types: Cigarettes  . Smokeless tobacco: Former Neurosurgeon  . Alcohol use Yes    FAMILY HISTORY:   Family History  Problem Relation Age of Onset  . Diabetes Brother   . Heart failure Brother   . Hyperlipidemia Father   . CAD Father     DRUG ALLERGIES:   Allergies  Allergen Reactions  . Sulfa Antibiotics Hives    MEDICATIONS AT HOME:   Prior to Admission medications   Medication Sig Start Date End Date Taking? Authorizing Provider  aspirin EC 81 MG tablet Take 81 mg by mouth daily.   Yes Historical Provider, MD  cholecalciferol (VITAMIN D) 1000 units tablet Take 1,000 Units by mouth daily.   Yes Historical  Provider, MD  citalopram (CELEXA) 20 MG tablet Take 20 mg by mouth daily.   Yes Historical Provider, MD  fluticasone (FLONASE) 50 MCG/ACT nasal spray Place 1 spray into both nostrils daily.   Yes Historical Provider, MD  hydrOXYzine (ATARAX/VISTARIL) 25 MG tablet Take 25 mg by mouth every 8 (eight) hours as needed for itching.   Yes Historical Provider, MD  hyoscyamine (LEVSIN SL) 0.125 MG SL tablet Place 0.125 mg under the tongue every 4 (four) hours as needed for cramping.   Yes Historical Provider, MD  losartan-hydrochlorothiazide (HYZAAR) 100-12.5 MG tablet Take 1 tablet by mouth daily.   Yes Historical Provider, MD  montelukast (SINGULAIR) 10 MG tablet Take 10 mg by mouth at bedtime.   Yes Historical Provider, MD  Multiple Vitamin (MULTIVITAMIN WITH MINERALS) TABS tablet Take 1 tablet by mouth daily.   Yes Historical Provider, MD  pantoprazole (PROTONIX) 40 MG tablet Take 40 mg by mouth 2 (two) times daily before a meal.   Yes Historical Provider, MD  propranolol (INDERAL) 20 MG tablet Take 20 mg by mouth daily.   Yes Historical Provider, MD  traMADol-acetaminophen (ULTRACET) 37.5-325 MG tablet Take 1-2 tablets by mouth 2 (two) times daily as needed for moderate pain.   Yes Historical Provider, MD    REVIEW OF  SYSTEMS:  Review of Systems  Constitutional: Negative for chills, fever, malaise/fatigue and weight loss.  HENT: Negative for ear pain, hearing loss and tinnitus.   Eyes: Negative for blurred vision, double vision, pain and redness.  Respiratory: Negative for cough, hemoptysis and shortness of breath.   Cardiovascular: Negative for chest pain, palpitations, orthopnea and leg swelling.  Gastrointestinal: Negative for abdominal pain, constipation, diarrhea, nausea and vomiting.  Genitourinary: Negative for dysuria, frequency and hematuria.  Musculoskeletal: Positive for joint pain (left hip). Negative for back pain and neck pain.  Skin:       No acne, rash, or lesions  Neurological:  Negative for dizziness, tremors, focal weakness and weakness.  Endo/Heme/Allergies: Negative for polydipsia. Does not bruise/bleed easily.  Psychiatric/Behavioral: Negative for depression. The patient is not nervous/anxious and does not have insomnia.      VITAL SIGNS:   Vitals:   11/03/16 2300 11/03/16 2315 11/03/16 2330 11/03/16 2345  BP: (!) 105/49 (!) 99/48 (!) 117/50 (!) 106/50  Pulse: 89 86 87 88  Resp:      Temp:      TempSrc:      SpO2: 92% 93% 92% 93%  Weight:      Height:       Wt Readings from Last 3 Encounters:  11/03/16 59 kg (130 lb)    PHYSICAL EXAMINATION:  Physical Exam  Vitals reviewed. Constitutional: She is oriented to person, place, and time. She appears well-developed and well-nourished. No distress.  HENT:  Head: Normocephalic and atraumatic.  Mouth/Throat: Oropharynx is clear and moist.  Eyes: Conjunctivae and EOM are normal. Pupils are equal, round, and reactive to light. No scleral icterus.  Neck: Normal range of motion. Neck supple. No JVD present. No thyromegaly present.  Cardiovascular: Normal rate, regular rhythm and intact distal pulses.  Exam reveals no gallop and no friction rub.   No murmur heard. Respiratory: Effort normal and breath sounds normal. No respiratory distress. She has no wheezes. She has no rales.  GI: Soft. Bowel sounds are normal. She exhibits no distension. There is no tenderness.  Musculoskeletal: Normal range of motion. She exhibits tenderness (Left hip). She exhibits no edema.  No arthritis, no gout  Lymphadenopathy:    She has no cervical adenopathy.  Neurological: She is alert and oriented to person, place, and time. No cranial nerve deficit.  No dysarthria, no aphasia  Skin: Skin is warm and dry. No rash noted. No erythema.  Psychiatric: She has a normal mood and affect. Her behavior is normal. Judgment and thought content normal.    LABORATORY PANEL:   CBC  Recent Labs Lab 11/03/16 1939  WBC 17.6*  HGB  10.3*  HCT 30.4*  PLT 338   ------------------------------------------------------------------------------------------------------------------  Chemistries   Recent Labs Lab 11/03/16 1939  NA 133*  K 4.8  CL 98*  CO2 26  GLUCOSE 124*  BUN 33*  CREATININE 1.28*  CALCIUM 8.7*  AST 45*  ALT 18  ALKPHOS 77  BILITOT 1.3*   ------------------------------------------------------------------------------------------------------------------  Cardiac Enzymes  Recent Labs Lab 11/03/16 2149  TROPONINI 0.03*   ------------------------------------------------------------------------------------------------------------------  RADIOLOGY:  Dg Chest 2 View  Result Date: 11/03/2016 CLINICAL DATA:  Patient if from [facility], she can normally ambulate with a walker but for the last 4 days has been unable to bear weight. She reports no fall. She has been feeling tired.HX afib, gastritis, htn, reactive airway disease, former smoker EXAM: CHEST  2 VIEW COMPARISON:  None. FINDINGS: The cardiac silhouette is mildly  enlarged. No mediastinal or hilar masses. No convincing adenopathy. There is lung base opacity most evident posteriorly on the left. This may be atelectasis/ scarring. Consider pneumonia if this correlates clinically. Remainder of the lungs is clear. There is no pulmonary edema. No pleural effusion.  No pneumothorax. Skeletal structures are demineralized but grossly intact. IMPRESSION: 1. Bibasilar lung opacity, greater on the left, likely atelectasis/scarring. Pneumonia is possible. 2. No evidence of pulmonary edema. 3. Mild cardiomegaly. Electronically Signed   By: Amie Portlandavid  Ormond M.D.   On: 11/03/2016 22:09   Ct Hip Left Wo Contrast  Result Date: 11/03/2016 CLINICAL DATA:  Hip pain EXAM: CT OF THE LEFT HIP WITHOUT CONTRAST TECHNIQUE: Multidetector CT imaging of the left hip was performed according to the standard protocol. Multiplanar CT image reconstructions were also generated.  COMPARISON:  Radiographs 11/03/2016 FINDINGS: Bones/Joint/Cartilage No fracture or dislocation is evident. There is no periostitis or bone destruction. A small hip effusion is suspected. Ligaments Suboptimally assessed by CT. Muscles and Tendons Mild fatty atrophy of the left hip musculature.  No obvious masses. Soft tissues Edema and inflammatory change involving the anterior compartment of the left hip. Femoral vascular calcification. Sigmoid colon diverticular disease. IMPRESSION: 1. No acute fracture or malalignment 2. Suspect small left hip effusion. Edema and inflammatory change within the soft tissues of the anterior compartment of the left hip. Findings could be secondary to infectious or inflammatory process. MRI recommended for further evaluation. Electronically Signed   By: Jasmine PangKim  Fujinaga M.D.   On: 11/03/2016 22:24   Dg Hip Unilat With Pelvis 2-3 Views Left  Result Date: 11/03/2016 CLINICAL DATA:  Nki; c/o left hip pain; can't remember how long its been hurting EXAM: DG HIP (WITH OR WITHOUT PELVIS) 2-3V LEFT COMPARISON:  None. FINDINGS: No acute fracture.  No bone lesion. Left hip joint is normally spaced and aligned. No significant arthropathic change. Right hip joint is normally aligned as are the SI joints and the symphysis pubis. Bones are extensively demineralized. Soft tissues are unremarkable. IMPRESSION: 1. No acute fracture, bone lesion or significant left hip joint abnormality. Electronically Signed   By: Amie Portlandavid  Ormond M.D.   On: 11/03/2016 20:47    EKG:   Orders placed or performed during the hospital encounter of 11/03/16  . EKG 12-Lead  . EKG 12-Lead    IMPRESSION AND PLAN:  Principal Problem:   Effusion of left hip - ultrasound-guided drainage ordered by ED physician, antibiotics in place Active Problems:   Leukocytosis - suspect possibility of septic left hip, antibiotics and treatment as above   HTN (hypertension) - continue home meds   Anxiety - home dose  anxiolytics  All the records are reviewed and case discussed with ED provider. Management plans discussed with the patient and/or family.  DVT PROPHYLAXIS: SubQ lovenox  GI PROPHYLAXIS: None  ADMISSION STATUS: Observation  CODE STATUS: DNR Code Status History    This patient does not have a recorded code status. Please follow your organizational policy for patients in this situation.    Advance Directive Documentation   Flowsheet Row Most Recent Value  Type of Advance Directive  Out of facility DNR (pink MOST or yellow form)  Pre-existing out of facility DNR order (yellow form or pink MOST form)  No data  "MOST" Form in Place?  No data      TOTAL TIME TAKING CARE OF THIS PATIENT: 40 minutes.    Nikira Kushnir FIELDING 11/04/2016, 12:27 AM  [facility]  Office  [phone]  CC: Primary care physician; No PCP Per Patient

## 2016-11-04 NOTE — Progress Notes (Addendum)
Patient ID: NORETTA FRIER, female   DOB: 07-Aug-1916, 83 y.o.   MRN: [id number]  [place] PROGRESS NOTE  TONIMARIE GRITZ WUJ:[id number] DOB: 05-21-1916 DOA: 11/03/2016 PCP: No PCP Per Patient  HPI/Subjective: Patient came in with left hip pain and unable to walk.  She lives in independent living at [facility] [place] and usually walks with a walker. Patient does have some shortness of breath and cough.  Objective: Vitals:   11/04/16 0350 11/04/16 0732  BP: (!) 120/51 (!) 149/56  Pulse: 80 80  Resp: 18 16  Temp: 98.2 F (36.8 C) 97.9 F (36.6 C)    Filed Weights   11/03/16 1914  Weight: 59 kg (130 lb)    ROS: Review of Systems  Constitutional: Negative for chills and fever.  Eyes: Negative for blurred vision.  Respiratory: Positive for cough and shortness of breath.   Cardiovascular: Negative for chest pain.  Gastrointestinal: Negative for abdominal pain, constipation, diarrhea, nausea and vomiting.  Genitourinary: Negative for dysuria.  Musculoskeletal: Positive for joint pain.  Neurological: Negative for dizziness and headaches.   Exam: Physical Exam  Constitutional: She is oriented to person, place, and time.  HENT:  Nose: No mucosal edema.  Mouth/Throat: No oropharyngeal exudate or posterior oropharyngeal edema.  Eyes: Conjunctivae, EOM and lids are normal. Pupils are equal, round, and reactive to light.  Neck: No JVD present. Carotid bruit is not present. No edema present. No thyroid mass and no thyromegaly present.  Cardiovascular: S1 normal and S2 normal.  Exam reveals no gallop.   No murmur heard. Pulses:      Dorsalis pedis pulses are 2+ on the right side, and 2+ on the left side.  Respiratory: No respiratory distress. She has decreased breath sounds in the right middle field, the right lower field, the left middle field and the left lower field. She has wheezes in the right middle field, the right lower field, the left middle field and the left lower field. She  has no rhonchi. She has no rales.  GI: Soft. Bowel sounds are normal. There is no tenderness.  Musculoskeletal:       Left hip: She exhibits decreased range of motion, decreased strength, tenderness and swelling.       Right ankle: She exhibits swelling.       Left ankle: She exhibits swelling.  Lymphadenopathy:    She has no cervical adenopathy.  Neurological: She is alert and oriented to person, place, and time. No cranial nerve deficit.  Patient unable to straight leg raise with her left leg. She is able to hold her leg up in the air after I did passive range of motion. Pain with flexion and internal rotation.  Skin: Skin is warm. No rash noted. Nails show no clubbing.  Stage I decubiti sacrum  Psychiatric: She has a normal mood and affect.      Data Reviewed: Basic Metabolic Panel:  Recent Labs Lab 11/03/16 1939 11/04/16 0352  NA 133* 138  K 4.8 3.8  CL 98* 102  CO2 26 28  GLUCOSE 124* 107*  BUN 33* 31*  CREATININE 1.28* 1.18*  CALCIUM 8.7* 8.5*   Liver Function Tests:  Recent Labs Lab 11/03/16 1939  AST 45*  ALT 18  ALKPHOS 77  BILITOT 1.3*  PROT 7.4  ALBUMIN 3.6   CBC:  Recent Labs Lab 11/03/16 1939 11/04/16 0352  WBC 17.6* 12.8*  NEUTROABS 15.3*  --   HGB 10.3* 9.3*  HCT 30.4* 28.0*  MCV 87.0  89.8  PLT 338 304   Cardiac Enzymes:  Recent Labs Lab 11/03/16 1939 11/03/16 2149  TROPONINI 0.03* 0.03*     Studies: Dg Chest 2 View  Result Date: 11/03/2016 CLINICAL DATA:  Patient if from [facility], she can normally ambulate with a walker but for the last 4 days has been unable to bear weight. She reports no fall. She has been feeling tired.HX afib, gastritis, htn, reactive airway disease, former smoker EXAM: CHEST  2 VIEW COMPARISON:  None. FINDINGS: The cardiac silhouette is mildly enlarged. No mediastinal or hilar masses. No convincing adenopathy. There is lung base opacity most evident posteriorly on the left. This may be atelectasis/  scarring. Consider pneumonia if this correlates clinically. Remainder of the lungs is clear. There is no pulmonary edema. No pleural effusion.  No pneumothorax. Skeletal structures are demineralized but grossly intact. IMPRESSION: 1. Bibasilar lung opacity, greater on the left, likely atelectasis/scarring. Pneumonia is possible. 2. No evidence of pulmonary edema. 3. Mild cardiomegaly. Electronically Signed   By: Lajean Manes M.D.   On: 11/03/2016 22:09   Ct Hip Left Wo Contrast  Result Date: 11/03/2016 CLINICAL DATA:  Hip pain EXAM: CT OF THE LEFT HIP WITHOUT CONTRAST TECHNIQUE: Multidetector CT imaging of the left hip was performed according to the standard protocol. Multiplanar CT image reconstructions were also generated. COMPARISON:  Radiographs 11/03/2016 FINDINGS: Bones/Joint/Cartilage No fracture or dislocation is evident. There is no periostitis or bone destruction. A small hip effusion is suspected. Ligaments Suboptimally assessed by CT. Muscles and Tendons Mild fatty atrophy of the left hip musculature.  No obvious masses. Soft tissues Edema and inflammatory change involving the anterior compartment of the left hip. Femoral vascular calcification. Sigmoid colon diverticular disease. IMPRESSION: 1. No acute fracture or malalignment 2. Suspect small left hip effusion. Edema and inflammatory change within the soft tissues of the anterior compartment of the left hip. Findings could be secondary to infectious or inflammatory process. MRI recommended for further evaluation. Electronically Signed   By: Donavan Foil M.D.   On: 11/03/2016 22:24   Dg Hip Unilat With Pelvis 2-3 Views Left  Result Date: 11/03/2016 CLINICAL DATA:  Nki; c/o left hip pain; can't remember how long its been hurting EXAM: DG HIP (WITH OR WITHOUT PELVIS) 2-3V LEFT COMPARISON:  None. FINDINGS: No acute fracture.  No bone lesion. Left hip joint is normally spaced and aligned. No significant arthropathic change. Right hip joint is  normally aligned as are the SI joints and the symphysis pubis. Bones are extensively demineralized. Soft tissues are unremarkable. IMPRESSION: 1. No acute fracture, bone lesion or significant left hip joint abnormality. Electronically Signed   By: Lajean Manes M.D.   On: 11/03/2016 20:47    Scheduled Meds: . aspirin EC  81 mg Oral Daily  . azithromycin  500 mg Oral Daily   Followed by  . [START ON 11/05/2016] azithromycin  250 mg Oral Daily  . budesonide (PULMICORT) nebulizer solution  0.5 mg Nebulization BID  . cefTRIAXone (ROCEPHIN)  IV  1 g Intravenous Q24H  . citalopram  20 mg Oral Daily  . enoxaparin (LOVENOX) injection  30 mg Subcutaneous Q24H  . ipratropium-albuterol  3 mL Nebulization Q6H  . montelukast  10 mg Oral QHS  . pantoprazole  40 mg Oral BID AC  . prednisoLONE  20 mg Oral Daily  . propranolol  20 mg Oral Daily    Assessment/Plan:  1. COPD exacerbation with possible pneumonia, leukocytosis. Patient has a history of  reactive airway disease Start Budesonide nebulizers and DuoNeb nebulizers. Low-dose prednisone. Change antibiotics to Rocephin and Zithromax to cover possible pneumonia. 2. Left hip pain with small effusion. Case discussed with orthopedic surgery to evaluate the patient today to see if imaging or procedure needed. Add on an ESR and sedimentation rate. As per orthopedic surgery most the time we do not need antibiotics for this. 3. History of atrial fibrillation on aspirin 4. Anxiety on Celexa 5. GERD on Protonix.  Code Status:     Code Status Orders        Start     Ordered   11/04/16 0120  Do not attempt resuscitation (DNR)  Continuous    Question Answer Comment  In the event of cardiac or respiratory ARREST Do not call a "code blue"   In the event of cardiac or respiratory ARREST Do not perform Intubation, CPR, defibrillation or ACLS   In the event of cardiac or respiratory ARREST Use medication by any route, position, wound care, and other measures  to relive pain and suffering. May use oxygen, suction and manual treatment of airway obstruction as needed for comfort.      11/04/16 0119    Code Status History    Date Active Date Inactive Code Status Order ID Comments User Context   This patient has a current code status but no historical code status.    Advance Directive Documentation   Flowsheet Row Most Recent Value  Type of Advance Directive  Out of facility DNR (pink MOST or yellow form)  Pre-existing out of facility DNR order (yellow form or pink MOST form)  Yellow form placed in chart (order not valid for inpatient use)  "MOST" Form in Place?  No data     Disposition Plan: TBD based on Physical therapy evaluation  Consultants:  Orthopedic surgey  Time spent: 35 minutes, Case discussed with son on the phone and given update on patient's condition. Case also discussed with orthopedic surgery.  Loletha Grayer  [employer]

## 2016-11-04 NOTE — Care Management Obs Status (Signed)
MEDICARE OBSERVATION STATUS NOTIFICATION   Patient Details  Name: Michele Cooper MRN: [id number] Date of Birth: 08/03/1916   Medicare Observation Status Notification Given:  Yes (patient confused. OBS status discussed with her son Michele Cooper on the phone. MOON form left at bedside for him. )    Sorina Derrig A, RN 11/04/2016, 4:02 PM

## 2016-11-04 NOTE — Consult Note (Signed)
Reason for Consult:left hip pain Referring Physician:   [name] is an 83 y.o. female.  HPI: Michele Cooper is a 83 year old female with a 4-5 day history of left hip pain. Pain insidious in onset. No fall or trauma. Pt is unable to bear weight/ambulate due to pain. Pain location: diffuse left hip but greatest over lateral aspect. She denies fevers/chills or recent illness. No prior history of hip pain. She has been admitted to medial service and being treated for possible pneumonia as well.  Past Medical History:  Diagnosis Date  . A-fib ([place])   . Anxiety   . Arthritis   . Gastritis   . Hypertension   . Reactive airway disease     Past Surgical History:  Procedure Laterality Date  . ABDOMINAL HYSTERECTOMY      Family History  Problem Relation Age of Onset  . Diabetes Brother   . Heart failure Brother   . Hyperlipidemia Father   . CAD Father     Social History:  reports that she has quit smoking. Her smoking use included Cigarettes. She has quit using smokeless tobacco. She reports that she drinks about 0.6 oz of alcohol per week . She reports that she does not use drugs.  Allergies:  Allergies  Allergen Reactions  . Sulfa Antibiotics Hives    Medications: I have reviewed the patient's current medications.  Results for orders placed or performed during the hospital encounter of 11/03/16 (from the past 48 hour(s))  Comprehensive metabolic panel     Status: Abnormal   Collection Time: 11/03/16  7:39 PM  Result Value Ref Range   Sodium 133 (L) 135 - 145 mmol/L   Potassium 4.8 3.5 - 5.1 mmol/L    Comment: HEMOLYSIS AT THIS LEVEL MAY AFFECT RESULT   Chloride 98 (L) 101 - 111 mmol/L   CO2 26 22 - 32 mmol/L   Glucose, Bld 124 (H) 65 - 99 mg/dL   BUN 33 (H) 6 - 20 mg/dL   Creatinine, Ser 1.28 (H) 0.44 - 1.00 mg/dL   Calcium 8.7 (L) 8.9 - 10.3 mg/dL   Total Protein 7.4 6.5 - 8.1 g/dL   Albumin 3.6 3.5 - 5.0 g/dL   AST 45 (H) 15 - 41 U/L    Comment: HEMOLYSIS AT THIS  LEVEL MAY AFFECT RESULT   ALT 18 14 - 54 U/L    Comment: HEMOLYSIS AT THIS LEVEL MAY AFFECT RESULT   Alkaline Phosphatase 77 38 - 126 U/L   Total Bilirubin 1.3 (H) 0.3 - 1.2 mg/dL    Comment: HEMOLYSIS AT THIS LEVEL MAY AFFECT RESULT   GFR calc non Af Amer 34 (L) >60 mL/min   GFR calc Af Amer 39 (L) >60 mL/min    Comment: (NOTE) The eGFR has been calculated using the CKD EPI equation. This calculation has not been validated in all clinical situations. eGFR's persistently <60 mL/min signify possible Chronic Kidney Disease.    Anion gap 9 5 - 15  CBC with Differential     Status: Abnormal   Collection Time: 11/03/16  7:39 PM  Result Value Ref Range   WBC 17.6 (H) 3.6 - 11.0 K/uL   RBC 3.50 (L) 3.80 - 5.20 MIL/uL   Hemoglobin 10.3 (L) 12.0 - 16.0 g/dL   HCT 30.4 (L) 35.0 - 47.0 %   MCV 87.0 80.0 - 100.0 fL   MCH 29.3 26.0 - 34.0 pg   MCHC 33.7 32.0 - 36.0 g/dL   RDW  15.4 (H) 11.5 - 14.5 %   Platelets 338 150 - 440 K/uL   Neutrophils Relative % 88 %   Neutro Abs 15.3 (H) 1.4 - 6.5 K/uL   Lymphocytes Relative 7 %   Lymphs Abs 1.2 1.0 - 3.6 K/uL   Monocytes Relative 5 %   Monocytes Absolute 0.9 0.2 - 0.9 K/uL   Eosinophils Relative 0 %   Eosinophils Absolute 0.1 0 - 0.7 K/uL   Basophils Relative 0 %   Basophils Absolute 0.1 0 - 0.1 K/uL  Troponin I     Status: Abnormal   Collection Time: 11/03/16  7:39 PM  Result Value Ref Range   Troponin I 0.03 (HH) <0.03 ng/mL    Comment: CRITICAL RESULT CALLED TO, READ BACK BY AND VERIFIED WITH DAWN [facility] AT 2036 11/03/16.PMH  Urinalysis, Complete w Microscopic     Status: Abnormal   Collection Time: 11/03/16  7:39 PM  Result Value Ref Range   Color, Urine YELLOW (A) YELLOW   APPearance CLEAR (A) CLEAR   Specific Gravity, Urine 1.014 1.005 - 1.030   pH 6.0 5.0 - 8.0   Glucose, UA NEGATIVE NEGATIVE mg/dL   Hgb urine dipstick NEGATIVE NEGATIVE   Bilirubin Urine NEGATIVE NEGATIVE   Ketones, ur NEGATIVE NEGATIVE mg/dL   Protein, ur  NEGATIVE NEGATIVE mg/dL   Nitrite NEGATIVE NEGATIVE   Leukocytes, UA NEGATIVE NEGATIVE   RBC / HPF 0-5 0 - 5 RBC/hpf   WBC, UA 0-5 0 - 5 WBC/hpf   Bacteria, UA NONE SEEN NONE SEEN   Squamous Epithelial / LPF NONE SEEN NONE SEEN   Mucous PRESENT   Protime-INR     Status: None   Collection Time: 11/03/16  7:39 PM  Result Value Ref Range   Prothrombin Time 12.9 11.4 - 15.2 seconds   INR 0.97   Sedimentation rate     Status: Abnormal   Collection Time: 11/03/16  7:39 PM  Result Value Ref Range   Sed Rate 67 (H) 0 - 30 mm/hr  Troponin I     Status: Abnormal   Collection Time: 11/03/16  9:49 PM  Result Value Ref Range   Troponin I 0.03 (HH) <0.03 ng/mL    Comment: CRITICAL VALUE NOTED. VALUE IS CONSISTENT WITH PREVIOUSLY REPORTED/CALLED VALUE.PMH  Basic metabolic panel     Status: Abnormal   Collection Time: 11/04/16  3:52 AM  Result Value Ref Range   Sodium 138 135 - 145 mmol/L   Potassium 3.8 3.5 - 5.1 mmol/L   Chloride 102 101 - 111 mmol/L   CO2 28 22 - 32 mmol/L   Glucose, Bld 107 (H) 65 - 99 mg/dL   BUN 31 (H) 6 - 20 mg/dL   Creatinine, Ser 1.18 (H) 0.44 - 1.00 mg/dL   Calcium 8.5 (L) 8.9 - 10.3 mg/dL   GFR calc non Af Amer 37 (L) >60 mL/min   GFR calc Af Amer 43 (L) >60 mL/min    Comment: (NOTE) The eGFR has been calculated using the CKD EPI equation. This calculation has not been validated in all clinical situations. eGFR's persistently <60 mL/min signify possible Chronic Kidney Disease.    Anion gap 8 5 - 15  CBC     Status: Abnormal   Collection Time: 11/04/16  3:52 AM  Result Value Ref Range   WBC 12.8 (H) 3.6 - 11.0 K/uL   RBC 3.12 (L) 3.80 - 5.20 MIL/uL   Hemoglobin 9.3 (L) 12.0 - 16.0 g/dL   HCT  28.0 (L) 35.0 - 47.0 %   MCV 89.8 80.0 - 100.0 fL   MCH 29.9 26.0 - 34.0 pg   MCHC 33.3 32.0 - 36.0 g/dL   RDW 14.8 (H) 11.5 - 14.5 %   Platelets 304 150 - 440 K/uL    Dg Chest 2 View  Result Date: 11/03/2016 CLINICAL DATA:  Patient if from [facility], she can  normally ambulate with a walker but for the last 4 days has been unable to bear weight. She reports no fall. She has been feeling tired.HX afib, gastritis, htn, reactive airway disease, former smoker EXAM: CHEST  2 VIEW COMPARISON:  None. FINDINGS: The cardiac silhouette is mildly enlarged. No mediastinal or hilar masses. No convincing adenopathy. There is lung base opacity most evident posteriorly on the left. This may be atelectasis/ scarring. Consider pneumonia if this correlates clinically. Remainder of the lungs is clear. There is no pulmonary edema. No pleural effusion.  No pneumothorax. Skeletal structures are demineralized but grossly intact. IMPRESSION: 1. Bibasilar lung opacity, greater on the left, likely atelectasis/scarring. Pneumonia is possible. 2. No evidence of pulmonary edema. 3. Mild cardiomegaly. Electronically Signed   By: Lajean Manes M.D.   On: 11/03/2016 22:09   Ct Hip Left Wo Contrast  Result Date: 11/03/2016 CLINICAL DATA:  Hip pain EXAM: CT OF THE LEFT HIP WITHOUT CONTRAST TECHNIQUE: Multidetector CT imaging of the left hip was performed according to the standard protocol. Multiplanar CT image reconstructions were also generated. COMPARISON:  Radiographs 11/03/2016 FINDINGS: Bones/Joint/Cartilage No fracture or dislocation is evident. There is no periostitis or bone destruction. A small hip effusion is suspected. Ligaments Suboptimally assessed by CT. Muscles and Tendons Mild fatty atrophy of the left hip musculature.  No obvious masses. Soft tissues Edema and inflammatory change involving the anterior compartment of the left hip. Femoral vascular calcification. Sigmoid colon diverticular disease. IMPRESSION: 1. No acute fracture or malalignment 2. Suspect small left hip effusion. Edema and inflammatory change within the soft tissues of the anterior compartment of the left hip. Findings could be secondary to infectious or inflammatory process. MRI recommended for further evaluation.  Electronically Signed   By: Donavan Foil M.D.   On: 11/03/2016 22:24   Dg Hip Unilat With Pelvis 2-3 Views Left  Result Date: 11/03/2016 CLINICAL DATA:  Nki; c/o left hip pain; can't remember how long its been hurting EXAM: DG HIP (WITH OR WITHOUT PELVIS) 2-3V LEFT COMPARISON:  None. FINDINGS: No acute fracture.  No bone lesion. Left hip joint is normally spaced and aligned. No significant arthropathic change. Right hip joint is normally aligned as are the SI joints and the symphysis pubis. Bones are extensively demineralized. Soft tissues are unremarkable. IMPRESSION: 1. No acute fracture, bone lesion or significant left hip joint abnormality. Electronically Signed   By: Lajean Manes M.D.   On: 11/03/2016 20:47    ROS Blood pressure (!) 149/56, pulse 80, temperature 97.9 F (36.6 C), resp. rate 16, height 4' 11"  (1.499 m), weight 59 kg (130 lb), SpO2 94 %. Physical Exam: Left hip tender to palpate laterally over greater trochanter and posterolateral aspect of left hip. No ecchymosis. Compartments soft.  Negative hip logroll test. Mild reproducible groin pain with PROM of the left hip in groin. PROM: 90 degrees flexion, IR 15 degrees, ER 30 degrees.  + reproducible pain with IR/ER posterolateral hip  RLE with full painless PROM of hip  Bilateral knee ROM 0-100 degrees. Mild chronic edema bilateral knees consistent with degenerative changes  Assessment/Plan: 1. Left hip pain 2. Left hip bursitis 3. Possible pneumonia/reactive airway disease  -DDx of patient's hip pain would include left hip bursitis/tendonitis vs septic joint. CT scan results were personally reviewed. No fractures identified. Mild soft tissue swelling anteriorly. Small hip effusion. -Recommend MRI scan left hip to evaluate anterior soft tissue swelling -ok to mobilize patient/WBAT. -pain control -agree with po steroids or NSAIDs prn pain -consider IR guided hip aspiration if MRI scan demonstates intra-articular or  iliopsoas fluid collecton.  Jaymes Graff 11/04/2016, 9:33 AM

## 2016-11-05 ENCOUNTER — Observation Stay: Payer: Medicare Other

## 2016-11-05 DIAGNOSIS — J441 Chronic obstructive pulmonary disease with (acute) exacerbation: Secondary | ICD-10-CM | POA: Diagnosis not present

## 2016-11-05 LAB — URINE CULTURE: CULTURE: NO GROWTH

## 2016-11-05 MED ORDER — AMOXICILLIN-POT CLAVULANATE 875-125 MG PO TABS
1.0000 | ORAL_TABLET | Freq: Two times a day (BID) | ORAL | Status: DC
  Administered 2016-11-05 – 2016-11-06 (×3): 1 via ORAL
  Filled 2016-11-05 (×4): qty 1

## 2016-11-05 NOTE — Progress Notes (Signed)
Physical Therapy Treatment Patient Details Name: Michele Cooper MRN: [id number] DOB: 11/30/1915 Today's Date: 11/05/2016    History of Present Illness Michele Cooper  is a 83 y.o. female who presents with left hip pain. Patient states that her hip began bothering her about 3-4 days ago. She reached a point where she was unable to bear weight on it safely. She states she did not fall or sustain any trauma to it. Here in the ED imaging shows no fracture but does show an effusion. Patient also has leukocytosis on laboratory evaluation. She was given a dose of antibiotics in the ED and Hospitalists were called for admission and further evaluation. Pt has been evaluated by ortho who recommended an MRI. She was cleared for mobility and is WBAT on LLE.     PT Comments    Pt awake, alert and stated her pain has improved currently 5/10 in L hip and described as throbbing sensation. She displayed some improvements in mobility secondary to decreased pain but still requires min assist for bed mobility, transfers and ambulation. She displayed decrease stability in standing requiring use of RW and min assist to maintain balance. Pt is a high fall risk and ambulated to chair w/ RW and min assist w/ decreased step length and displayed sings of labored breathing and fatigue after ambulating and performing therex. HR was 100 BPM and O2 was 90% after exercise. Pt currently displays decreased activity tolerance with increased pain as well as strength and balance deficits that limit functional mobility. She will continue to benefit from skilled PT recommend pt transition to STR following acute hospital stay.    Follow Up Recommendations  SNF     Equipment Recommendations  None recommended by PT    Recommendations for Other Services       Precautions / Restrictions Precautions Precautions: Fall Restrictions Weight Bearing Restrictions: Yes LLE Weight Bearing: Weight bearing as tolerated    Mobility  Bed  Mobility Overal bed mobility: Needs Assistance Bed Mobility: Supine to Sit     Supine to sit: Min assist;HOB elevated     General bed mobility comments: cuing for and assist for advancement of LE's and bringing trunk into upright position w/ elevated HOB  Transfers Overall transfer level: Needs assistance Equipment used: Rolling walker (2 wheeled) Transfers: Sit to/from Stand Sit to Stand: Min assist         General transfer comment: cuing for hand placement, requries increased time to transfer to upright stance, leans posteriorly when standing able to correct on second attempt w/ better hand placement  Ambulation/Gait Ambulation/Gait assistance: Min assist;+2 safety/equipment Ambulation Distance (Feet): 4 Feet Assistive device: Rolling walker (2 wheeled) Gait Pattern/deviations: Step-to pattern;Decreased step length - right;Decreased step length - left;Shuffle;Trunk flexed Gait velocity: Decreased Gait velocity interpretation: <1.8 ft/sec, indicative of risk for recurrent falls General Gait Details: ambulated to chair, takes very short steps w/ shuffling, increased difficulty weight shifting to L LE due to hip pain that limits step length of R LE   Stairs            Wheelchair Mobility    Modified Rankin (Stroke Patients Only)       Balance Overall balance assessment: Needs assistance Sitting-balance support: Feet supported;Single extremity supported Sitting balance-Leahy Scale: Good Sitting balance - Comments: able to sit at EOB w/ use of bedrail for support Postural control: Posterior lean Standing balance support: Bilateral upper extremity supported Standing balance-Leahy Scale: Poor Standing balance comment: leans posteriorly, requires RW for bilat  UE support,                     Cognition Arousal/Alertness: Awake/alert Behavior During Therapy: WFL for tasks assessed/performed Overall Cognitive Status: Within Functional Limits for tasks  assessed                      Exercises General Exercises - Lower Extremity Hip Flexion/Marching: AROM;Strengthening;Both;15 reps;Standing (w/ RW and min assist, limited by fatigue)    General Comments        Pertinent Vitals/Pain Pain Assessment: 0-10 Pain Score: 5  Pain Location: L hip  Pain Descriptors / Indicators: Throbbing Pain Intervention(s): Monitored during session;Premedicated before session;Limited activity within patient's tolerance    Home Living                      Prior Function            PT Goals (current goals can now be found in the care plan section) Acute Rehab PT Goals Patient Stated Goal: Return to prior level of function at her apartment PT Goal Formulation: With patient Time For Goal Achievement: 11/18/16 Potential to Achieve Goals: Good Progress towards PT goals: Progressing toward goals    Frequency    7X/week      PT Plan Current plan remains appropriate    Co-evaluation             End of Session Equipment Utilized During Treatment: Gait belt Activity Tolerance: Patient limited by fatigue;Patient limited by pain Patient left: in chair;with call bell/phone within reach;with chair alarm set Nurse Communication: Mobility status PT Visit Diagnosis: Unsteadiness on feet (R26.81);Muscle weakness (generalized) (M62.81);Pain Pain - Right/Left: Left Pain - part of body: Hip     Time: 1610-9604 PT Time Calculation (min) (ACUTE ONLY): 23 min  Charges:                       G Codes:       [employer]  Student PT 11/05/2016, 9:58 AM

## 2016-11-05 NOTE — Progress Notes (Signed)
Patient ID: Michele Cooper, female   DOB: [birth year], 83 y.o.   MRN: [id number]  [employer] PROGRESS NOTE  Michele Cooper WUJ:[id number] DOB: July 11, 1916 DOA: 11/03/2016 PCP: Danella Penton, MD  HPI/Subjective: Patient came in with left hip pain and unable to walk.  She lives in independent living at [facility] [place] and usually walks with a walker.   Patient's hip pain is much better shortness of breath improved and had a bowel movement today.  Objective: Vitals:   11/05/16 0352 11/05/16 0821  BP: (!) 125/53 (!) 158/66  Pulse: 72 82  Resp: 16   Temp: 97.7 F (36.5 C) 97.6 F (36.4 C)    Filed Weights   11/03/16 1914  Weight: 59 kg (130 lb)    ROS: Review of Systems  Constitutional: Negative for chills and fever.  Eyes: Negative for blurred vision.  Respiratory: Positive for cough. Negative for shortness of breath.   Cardiovascular: Negative for chest pain.  Gastrointestinal: Negative for abdominal pain, constipation, diarrhea, nausea and vomiting.  Genitourinary: Negative for dysuria.  Musculoskeletal: Positive for joint pain.  Neurological: Negative for dizziness and headaches.   Exam: Physical Exam  Constitutional: She is oriented to person, place, and time.  HENT:  Nose: No mucosal edema.  Mouth/Throat: No oropharyngeal exudate or posterior oropharyngeal edema.  Eyes: Conjunctivae, EOM and lids are normal. Pupils are equal, round, and reactive to light.  Neck: No JVD present. Carotid bruit is not present. No edema present. No thyroid mass and no thyromegaly present.  Cardiovascular: S1 normal and S2 normal.  Exam reveals no gallop.   No murmur heard. Pulses:      Dorsalis pedis pulses are 2+ on the right side, and 2+ on the left side.  Respiratory: No respiratory distress. She has no decreased breath sounds. She has no wheezes. She has no rhonchi. She has no rales.  GI: Soft. Bowel sounds are normal. There is no tenderness.  Musculoskeletal:       Left hip: She  exhibits decreased range of motion, decreased strength, tenderness and swelling.       Right ankle: She exhibits swelling.       Left ankle: She exhibits swelling.  Lymphadenopathy:    She has no cervical adenopathy.  Neurological: She is alert and oriented to person, place, and time. No cranial nerve deficit.  Patient unable to straight leg raise with her left leg. She is able to hold her leg up in the air after I did passive range of motion. Pain with flexion and internal rotation.  Skin: Skin is warm. No rash noted. Nails show no clubbing.  Stage I decubiti sacrum  Psychiatric: She has a normal mood and affect.      Data Reviewed: Basic Metabolic Panel:  Recent Labs Lab 11/03/16 1939 11/04/16 0352  NA 133* 138  K 4.8 3.8  CL 98* 102  CO2 26 28  GLUCOSE 124* 107*  BUN 33* 31*  CREATININE 1.28* 1.18*  CALCIUM 8.7* 8.5*   Liver Function Tests:  Recent Labs Lab 11/03/16 1939  AST 45*  ALT 18  ALKPHOS 77  BILITOT 1.3*  PROT 7.4  ALBUMIN 3.6   CBC:  Recent Labs Lab 11/03/16 1939 11/04/16 0352  WBC 17.6* 12.8*  NEUTROABS 15.3*  --   HGB 10.3* 9.3*  HCT 30.4* 28.0*  MCV 87.0 89.8  PLT 338 304   Cardiac Enzymes:  Recent Labs Lab 11/03/16 1939 11/03/16 2149  TROPONINI 0.03* 0.03*  Studies: Dg Chest 2 View  Result Date: 11/03/2016 CLINICAL DATA:  [facility] from [facility], she can normally ambulate with a walker but for the last 4 days has been unable to bear weight. She reports no fall. She has been feeling tired.HX afib, gastritis, htn, reactive airway disease, former smoker EXAM: CHEST  2 VIEW COMPARISON:  None. FINDINGS: The cardiac silhouette is mildly enlarged. No mediastinal or hilar masses. No convincing adenopathy. There is lung base opacity most evident posteriorly on the left. This may be atelectasis/ scarring. Consider pneumonia if this correlates clinically. Remainder of the lungs is clear. There is no pulmonary edema. No pleural effusion.   No pneumothorax. Skeletal structures are demineralized but grossly intact. IMPRESSION: 1. Bibasilar lung opacity, greater on the left, likely atelectasis/scarring. Pneumonia is possible. 2. No evidence of pulmonary edema. 3. Mild cardiomegaly. Electronically Signed   By: Amie Portlandavid  Ormond M.D.   On: 11/03/2016 22:09   Ct Hip Left Wo Contrast  Result Date: 11/03/2016 CLINICAL DATA:  Hip pain EXAM: CT OF THE LEFT HIP WITHOUT CONTRAST TECHNIQUE: Multidetector CT imaging of the left hip was performed according to the standard protocol. Multiplanar CT image reconstructions were also generated. COMPARISON:  Radiographs 11/03/2016 FINDINGS: Bones/Joint/Cartilage No fracture or dislocation is evident. There is no periostitis or bone destruction. A small hip effusion is suspected. Ligaments Suboptimally assessed by CT. Muscles and Tendons Mild fatty atrophy of the left hip musculature.  No obvious masses. Soft tissues Edema and inflammatory change involving the anterior compartment of the left hip. Femoral vascular calcification. Sigmoid colon diverticular disease. IMPRESSION: 1. No acute fracture or malalignment 2. Suspect small left hip effusion. Edema and inflammatory change within the soft tissues of the anterior compartment of the left hip. Findings could be secondary to infectious or inflammatory process. MRI recommended for further evaluation. Electronically Signed   By: Jasmine PangKim  Fujinaga M.D.   On: 11/03/2016 22:24   Dg Hip Unilat With Pelvis 2-3 Views Left  Result Date: 11/03/2016 CLINICAL DATA:  Nki; c/o left hip pain; can't remember how long its been hurting EXAM: DG HIP (WITH OR WITHOUT PELVIS) 2-3V LEFT COMPARISON:  None. FINDINGS: No acute fracture.  No bone lesion. Left hip joint is normally spaced and aligned. No significant arthropathic change. Right hip joint is normally aligned as are the SI joints and the symphysis pubis. Bones are extensively demineralized. Soft tissues are unremarkable. IMPRESSION: 1.  No acute fracture, bone lesion or significant left hip joint abnormality. Electronically Signed   By: Amie Portlandavid  Ormond M.D.   On: 11/03/2016 20:47    Scheduled Meds: . aspirin EC  81 mg Oral Daily  . azithromycin  250 mg Oral Daily  . budesonide (PULMICORT) nebulizer solution  0.5 mg Nebulization BID  . cefTRIAXone (ROCEPHIN) IVPB 1 gram/50 mL D5W  1 g Intravenous Q24H  . citalopram  20 mg Oral Daily  . enoxaparin (LOVENOX) injection  30 mg Subcutaneous Q24H  . ipratropium-albuterol  3 mL Nebulization Q6H  . montelukast  10 mg Oral QHS  . pantoprazole  40 mg Oral BID AC  . predniSONE  20 mg Oral Q breakfast  . propranolol  20 mg Oral Daily    Assessment/Plan:  1. COPD exacerbation with possible pneumonia, leukocytosis. Patient has a history of reactive airway disease on Budesonide nebulizers and DuoNeb nebulizers. Low-dose prednisone. Change antibiotics to Rocephin and Zithromax to cover possible pneumonia. We will change IV Rocephin to by mouth Augmentin 2. Left hip pain with small  effusion. MRI of the hip is canceled by orthopedics as patient's hip pain is much better continue by mouth prednisone. PT is recommending skilled nursing facility 3. History of atrial fibrillation on aspirin. Rate controlled 4. Anxiety on Celexa 5. GERD on Protonix. 6. Anticipate discharge to skilled care tomorrow  Code Status:     Code Status Orders        Start     Ordered   11/04/16 0120  Do not attempt resuscitation (DNR)  Continuous    Question Answer Comment  In the event of cardiac or respiratory ARREST Do not call a "code blue"   In the event of cardiac or respiratory ARREST Do not perform Intubation, CPR, defibrillation or ACLS   In the event of cardiac or respiratory ARREST Use medication by any route, position, wound care, and other measures to relive pain and suffering. May use oxygen, suction and manual treatment of airway obstruction as needed for comfort.      11/04/16 0119    Code  Status History    Date Active Date Inactive Code Status Order ID Comments User Context   This patient has a current code status but no historical code status.    Advance Directive Documentation   Flowsheet Row Most Recent Value  Type of Advance Directive  Out of facility DNR (pink MOST or yellow form)  Pre-existing out of facility DNR order (yellow form or pink MOST form)  Yellow form placed in chart (order not valid for inpatient use)  "MOST" Form in Place?  No data     Disposition Plan: TBD based on Physical therapy evaluation  Consultants:  Orthopedic surgey  Time spent: 35 minutes, Case discussed with son on the phone and given update on patient's condition. Case also discussed with orthopedic surgery.  Enriqueta Augusta, [employer]

## 2016-11-05 NOTE — [place] FL2 (Addendum)
[place] MEDICAID FL2 LEVEL OF CARE SCREENING TOOL     IDENTIFICATION  Patient Name: Michele Cooper Birthdate: 05/25/1916 Sex: female Admission Date (Current Location): 11/03/2016  [place]   Facility and Address:  [facility], [address], [place], [place] [address]      Provider Number: [id number]  Attending Physician Name and Address:  Ramonita LabAruna Shamanda Len, MD  Relative Name and Phone Number:       Current Level of Care: Hospital Recommended Level of Care: Skilled Nursing Facility Prior Approval Number:    Date Approved/Denied:   PASRR Number:  [id number] )  Discharge Plan: SNF    Current Diagnoses: Patient Active Problem List   Diagnosis Date Noted  . Effusion of left hip 11/04/2016  . Leukocytosis 11/04/2016  . Anxiety 11/04/2016  . HTN (hypertension) 11/04/2016  . Pressure injury of skin 11/04/2016    Orientation RESPIRATION BLADDER Height & Weight     Self, Time, Situation, Place  Normal Incontinent Weight: 130 lb (59 kg) Height:  4\' 11"  (149.9 cm)  BEHAVIORAL SYMPTOMS/MOOD NEUROLOGICAL BOWEL NUTRITION STATUS   (none)  (none ) Continent Diet (Diet: 2 grams sodium. )  AMBULATORY STATUS COMMUNICATION OF NEEDS Skin   Extensive Assist Verbally Surgical wounds (Pressuer Ulcer Stage 1 on buttocks. )                       Personal Care Assistance Level of Assistance  Bathing, Feeding, Dressing Bathing Assistance: Limited assistance Feeding assistance: Independent Dressing Assistance: Limited assistance     Functional Limitations Info  Sight, Hearing, Speech Sight Info: Adequate Hearing Info: Impaired Speech Info: Adequate    SPECIAL CARE FACTORS FREQUENCY  PT (By licensed PT), OT (By licensed OT)     PT Frequency:  (5) OT Frequency:  (5)            Contractures      Additional Factors Info  Code Status, Allergies Code Status Info:  (DNR ) Allergies Info:  (Sulfa Antibiotics)            Current Medications (11/05/2016):  This is the current hospital active medication list Current Facility-Administered Medications  Medication Dose Route Frequency Provider Last Rate Last Dose  . acetaminophen (TYLENOL) tablet 650 mg  650 mg Oral Q6H PRN Oralia Manisavid Willis, MD       Or  . acetaminophen (TYLENOL) suppository 650 mg  650 mg Rectal Q6H PRN Oralia Manisavid Willis, MD      . aspirin EC tablet 81 mg  81 mg Oral Daily Oralia Manisavid Willis, MD   81 mg at 11/05/16 [id number]  . azithromycin [facility](ZITHROMAX) tablet 250 mg  250 mg Oral Daily Alford Highlandichard Wieting, MD   250 mg at 11/05/16 0911  . budesonide (PULMICORT) nebulizer solution 0.5 mg  0.5 mg Nebulization BID Alford Highlandichard Wieting, MD   0.5 mg at 11/05/16 0759  . cefTRIAXone (ROCEPHIN) 1 g in dextrose 5 % 50 mL IVPB  1 g Intravenous Q24H Cindi CarbonMary M Swayne, RPH   1 g at 11/05/16 [id number]  . citalopram (CELEXA) tablet 20 mg  20 mg Oral Daily Oralia Manisavid Willis, MD   20 mg at 11/05/16 0911  . enoxaparin (LOVENOX) injection 30 mg  30 mg Subcutaneous Q24H Oralia Manisavid Willis, MD   30 mg at 11/04/16 2057  . hydrOXYzine (ATARAX/VISTARIL) tablet 25 mg  25 mg Oral Q8H PRN Oralia Manisavid Willis, MD      . ipratropium-albuterol (DUONEB) 0.5-2.5 (3) MG/3ML nebulizer  solution 3 mL  3 mL Nebulization Q6H Alford Highland, MD   3 mL at 11/05/16 0759  . montelukast (SINGULAIR) tablet 10 mg  10 mg Oral QHS Oralia Manis, MD   10 mg at 11/04/16 2057  . ondansetron (ZOFRAN) tablet 4 mg  4 mg Oral Q6H PRN Oralia Manis, MD       Or  . ondansetron [facility]) injection 4 mg  4 mg Intravenous Q6H PRN Oralia Manis, MD      . pantoprazole (PROTONIX) EC tablet 40 mg  40 mg Oral BID AC Oralia Manis, MD   40 mg at 11/05/16 0748  . predniSONE (DELTASONE) tablet 20 mg  20 mg Oral Q breakfast Cindi Carbon, RPH   20 mg at 11/05/16 1610  . propranolol (INDERAL) tablet 20 mg  20 mg Oral Daily Oralia Manis, MD   20 mg at 11/05/16 0911  . traMADol (ULTRAM) tablet 50 mg  50 mg Oral Q6H PRN Oralia Manis, MD   50 mg at 11/05/16 9604      Discharge Medications: Please see discharge summary for a list of discharge medications.  Relevant Imaging Results:  Relevant Lab Results:   Additional Information  (SSN: [id number])  Sample, Darleen Crocker, LCSW

## 2016-11-05 NOTE — Clinical Social Work Note (Signed)
Clinical Social Work Assessment  Patient Details  Name: Michele Cooper MRN: [id number] Date of Birth: 07-Oct-1915  Date of referral:  11/05/16               Reason for consult:  Facility Placement                Permission sought to share information with:  Chartered certified accountant granted to share information::  Yes, Verbal Permission Granted  Name::      Chief Executive Officer::   [place]   Relationship::     Contact Information:     Housing/Transportation Living arrangements for the past 2 months:  Materials engineer, [place] of Information:  Patient, Adult Children Patient Interpreter Needed:  None Criminal Activity/Legal Involvement Pertinent to Current Situation/Hospitalization:  No - Comment as needed Significant Relationships:  Adult Children Lives with:  Self, Facility Resident Do you feel safe going back to the place where you live?  Yes Need for family participation in patient care:  Yes (Comment)  Care giving concerns:  Patient is an independent living resident at [facility]    Social Worker assessment / plan:  Holiday representative (CSW) reviewed chart and noted that PT is recommending SNF. Per chart patient is from [facility]. CSW contacted [facility] admissions coordinator at [facility] who reported that patient was at [facility] from 09/26/16-10/17/16 under private pay. Per Seth Bake patient can return to [facility] under private pay because she is medicare observation. CSW met with patient alone at bedside to discuss D/C plan. Patient was alert and oriented X4 and was sitting up in the chair. CSW introduced self and explained role of CSW department. Patient reported that she lives alone at [facility] independent living. CSW explained that PT is recommending for patient to go back to SNF and she will have to pay out of pocket because she is under medicare observation. Patient is agreeable to return to [facility] and pay privately. Patient asked CSW to call her son Michele Cooper and make him aware of above. CSW contacted Michele Cooper and explained D/C plan. Michele Cooper is in agreement with D/C plan. CSW will continue to follow and assist as needed.   Employment status:  Retired Forensic scientist:  Medicare PT Recommendations:  [place] / Referral to community resources:  [place]  Patient/Family's Response to care:  Patient and son are agreeable for patient to go to [facility].   Patient/Family's Understanding of and Emotional Response to Diagnosis, Current Treatment, and Prognosis:  Patient and son were very pleasant and thanked CSW for assistance.   Emotional Assessment Appearance:  Appears stated age Attitude/Demeanor/Rapport:    Affect (typically observed):  Accepting, Adaptable, Pleasant Orientation:  Oriented to Self, Oriented to Place, Oriented to  Time, Oriented to Situation Alcohol / Substance use:  Not Applicable Psych involvement (Current and /or in the community):  No (Comment)  Discharge Needs  Concerns to be addressed:  Discharge Planning Concerns Readmission within the last 30 days:  No Current discharge risk:  Dependent with Mobility Barriers to Discharge:  Continued Medical Work up   [employer], Veronia Beets, LCSW 11/05/2016, 11:54 AM

## 2016-11-05 NOTE — Clinical Social Work Placement (Signed)
   CLINICAL SOCIAL WORK PLACEMENT  NOTE  Date:  11/05/2016  Patient Details  Name: Cora Danielslizabeth L Tickner MRN: [id number] Date of Birth: 09/23/1915  Clinical Social Work is seeking post-discharge placement for this patient at the Skilled  Nursing Facility level of care (*CSW will initial, date and re-position this form in  chart as items are completed):  Yes   Patient/family provided with [facility] Clinical Social Work Department's list of facilities offering this level of care within the geographic area requested by the patient (or if unable, by the patient's family).  Yes   Patient/family informed of their freedom to choose among providers that offer the needed level of care, that participate in Medicare, Medicaid or managed care program needed by the patient, have an available bed and are willing to accept the patient.  Yes   Patient/family informed of [place]'s ownership interest in [facility] and [facility], as well as of the fact that they are under no obligation to receive care at these facilities.  PASRR submitted to EDS on       PASRR number received on       Existing PASRR number confirmed on 11/05/16     FL2 transmitted to all facilities in geographic area requested by pt/family on 11/05/16     FL2 transmitted to all facilities within larger geographic area on       Patient informed that his/her managed care company has contracts with or will negotiate with certain facilities, including the following:        Yes   Patient/family informed of bed offers received.  Patient [facility] bed at  [facility]     Physician recommends and patient chooses bed at      Patient to be transferred to   on  .  Patient to be transferred to facility by       Patient family notified on   of transfer.  Name of family member notified:        PHYSICIAN       Additional Comment:    _______________________________________________ Zakari Bathe, Darleen CrockerBailey M, LCSW 11/05/2016, 11:52  AM

## 2016-11-05 NOTE — Progress Notes (Signed)
Subjective :Patient is day 2 left hip pain Patient reports pain as moderate.  7/10 Pt states she is feeling better. Less pain No N/T/B to the lower ext. Rested well  Objective: Vital signs in last 24 hours: Temp:  [97.5 F (36.4 C)-97.7 F (36.5 C)] 97.7 F (36.5 C) (02/26 0352) Pulse Rate:  [72-79] 72 (02/26 0352) Resp:  [16-18] 16 (02/26 0352) BP: (121-157)/(45-78) 125/53 (02/26 0352) SpO2:  [92 %-94 %] 94 % (02/26 0352) Minimal tenderness to palpation to the lateral left hip Good ROM with mild discomfort.  NV/NS appears to be wnl  Intake/Output from previous day: 02/25 0701 - 02/26 0700 In: 290 [P.O.:240; IV Piggyback:50] Out: -  Intake/Output this shift: No intake/output data recorded.   Recent Labs  11/03/16 1939 11/04/16 0352  HGB 10.3* 9.3*    Recent Labs  11/03/16 1939 11/04/16 0352  WBC 17.6* 12.8*  RBC 3.50* 3.12*  HCT 30.4* 28.0*  PLT 338 304    Recent Labs  11/03/16 1939 11/04/16 0352  NA 133* 138  K 4.8 3.8  CL 98* 102  CO2 26 28  BUN 33* 31*  CREATININE 1.28* 1.18*  GLUCOSE 124* 107*  CALCIUM 8.7* 8.5*    Recent Labs  11/03/16 1939  INR 0.97    Neurologically intact Neurovascular intact Sensation intact distally Intact pulses distally Dorsiflexion/Plantar flexion intact No cellulitis present Compartment soft  Assessment/Plan: Left hip pain. Hold on mri at this time since she is improving  Case management to assist with discharge planning Physical therapy today Bowel movement today Labs in am Plan to discharge tomorrow poss.   WOLFE,JON R. 11/05/2016, 7:52 AM

## 2016-11-06 DIAGNOSIS — J441 Chronic obstructive pulmonary disease with (acute) exacerbation: Secondary | ICD-10-CM | POA: Diagnosis not present

## 2016-11-06 MED ORDER — AMOXICILLIN-POT CLAVULANATE 875-125 MG PO TABS
1.0000 | ORAL_TABLET | Freq: Two times a day (BID) | ORAL | 0 refills | Status: DC
Start: 2016-11-06 — End: 2017-01-01

## 2016-11-06 MED ORDER — TRAMADOL HCL 50 MG PO TABS: 50.0000 mg | ORAL_TABLET | Freq: Four times a day (QID) | ORAL | 0 refills | Status: DC | PRN

## 2016-11-06 MED ORDER — LOPERAMIDE HCL 2 MG PO CAPS
2.0000 mg | ORAL_CAPSULE | Freq: Four times a day (QID) | ORAL | Status: DC | PRN
  Administered 2016-11-06: 2 mg via ORAL
  Filled 2016-11-06: qty 1

## 2016-11-06 MED ORDER — BUDESONIDE 0.5 MG/2ML IN SUSP: 0.5000 mg | Freq: Two times a day (BID) | RESPIRATORY_TRACT | 0 refills | Status: AC

## 2016-11-06 MED ORDER — LOPERAMIDE HCL 2 MG PO CAPS: 2.0000 mg | ORAL_CAPSULE | ORAL | Status: DC | PRN

## 2016-11-06 MED ORDER — IPRATROPIUM-ALBUTEROL 0.5-2.5 (3) MG/3ML IN SOLN: 3.0000 mL | Freq: Four times a day (QID) | RESPIRATORY_TRACT | 0 refills | Status: AC

## 2016-11-06 MED ORDER — LOPERAMIDE HCL 2 MG PO CAPS
4.0000 mg | ORAL_CAPSULE | Freq: Once | ORAL | Status: AC
  Administered 2016-11-06: 4 mg via ORAL
  Filled 2016-11-06: qty 2

## 2016-11-06 MED ORDER — LOPERAMIDE HCL 2 MG PO CAPS: 2.0000 mg | ORAL_CAPSULE | ORAL | 0 refills | Status: AC | PRN

## 2016-11-06 MED ORDER — AZITHROMYCIN 250 MG PO TABS: ORAL_TABLET | ORAL | 0 refills | Status: DC

## 2016-11-06 MED ORDER — PREDNISONE 20 MG PO TABS: 20.0000 mg | ORAL_TABLET | Freq: Every day | ORAL | 0 refills | Status: DC

## 2016-11-06 MED ORDER — ACETAMINOPHEN 325 MG PO TABS: 650.0000 mg | ORAL_TABLET | Freq: Four times a day (QID) | ORAL | Status: AC | PRN

## 2016-11-06 MED ORDER — ONDANSETRON HCL 4 MG PO TABS: 4.0000 mg | ORAL_TABLET | Freq: Four times a day (QID) | ORAL | 0 refills | Status: DC | PRN

## 2016-11-06 NOTE — Progress Notes (Signed)
BP 190/80. MD notified. No orders received.

## 2016-11-06 NOTE — Discharge Instructions (Signed)
Continue physical therapy at skilled nursing facility   follow-up with primary care physician at the facility in 3 days Outpatient follow-up with the orthopedics in 1 week

## 2016-11-06 NOTE — Discharge Summary (Signed)
Patients IV removed.  DC papers given, explained and educated.  Scripts sent to [facility]. Informed of suggested FU appt and appt made.  RN assessment and VS revealed stability for DC. Family aware of discharge. EMS notified to transport 0- waiting on arrival.

## 2016-11-06 NOTE — Progress Notes (Signed)
Subjective :Patient is day 3 left hip pain. Patient reports pain as mild.  Not really strength in a lot of discomfort while laying in bed. Patient overall is feeling much better. Painful left hip still there but much improved.  Objective: Vital signs in last 24 hours: Temp:  [97.5 F (36.4 C)-98.2 F (36.8 C)] 98.2 F (36.8 C) (02/27 0428) Pulse Rate:  [70-92] 92 (02/27 0428) Resp:  [16-18] 16 (02/27 0428) BP: (101-201)/(49-85) 152/80 (02/27 0612) SpO2:  [94 %-97 %] 97 % (02/27 0727) No tenderness noted to palpation on today's visit to the left hip. Patient-moving her leg actively and did not appear to be having any discomfort. Range of motion the left hip produced only minimal pain on terminal motion. Patient is somewhat confused today. She does not remember having any therapy or getting out of bed yesterday. However the physical therapy notes states that she was able to ambulate 4 feet with assistance.  Intake/Output from previous day: 02/26 0701 - 02/27 0700 In: 240 [P.O.:240] Out: 0  Intake/Output this shift: No intake/output data recorded.   Recent Labs  11/03/16 1939 11/04/16 0352  HGB 10.3* 9.3*    Recent Labs  11/03/16 1939 11/04/16 0352  WBC 17.6* 12.8*  RBC 3.50* 3.12*  HCT 30.4* 28.0*  PLT 338 304    Recent Labs  11/03/16 1939 11/04/16 0352  NA 133* 138  K 4.8 3.8  CL 98* 102  CO2 26 28  BUN 33* 31*  CREATININE 1.28* 1.18*  GLUCOSE 124* 107*  CALCIUM 8.7* 8.5*    Recent Labs  11/03/16 1939  INR 0.97    Neurologically intact Neurovascular intact Sensation intact distally Intact pulses distally Dorsiflexion/Plantar flexion intact  Assessment/Plan: Left hip pain with improvement Case management to assist with discharge planning Physical therapy today Bowel movement today Labs in am Plan to discharge when medically cleared. We'll [facility] 3-4 weeks. Weight bear as tolerated   WOLFE,JON R. 11/06/2016,  7:46 AM

## 2016-11-06 NOTE — Care Management Important Message (Signed)
Important Message  Patient Details  Name: Michele Cooper MRN: [id number] Date of Birth: 02/14/1916   Medicare Important Message Given:  Yes    Marily MemosLisa M Alane Hanssen, RN 11/06/2016, 1:56 PM

## 2016-11-06 NOTE — Discharge Summary (Signed)
[facility] Physicians - [place] at [facility]   PATIENT NAME: Michele Cooper    MR#:  [id number]  DATE OF BIRTH:  May 30, 1916  DATE OF ADMISSION:  11/03/2016 ADMITTING PHYSICIAN: Oralia Manis, MD  DATE OF DISCHARGE: 11/06/16 PRIMARY CARE PHYSICIAN: Danella Penton, MD    ADMISSION DIAGNOSIS:  Weakness [R53.1] Effusion into joint [M25.40] Elevated troponin I level [R74.8] Hip joint effusion, left [M25.452]  DISCHARGE DIAGNOSIS:   Acute COPD exacerbation with possible pneumonia  Left hip pain secondary to bursitis SECONDARY DIAGNOSIS:   Past Medical History:  Diagnosis Date  . A-fib (HCC)   . Anxiety   . Arthritis   . Gastritis   . Hypertension   . Reactive airway disease     HOSPITAL COURSE:  HPI : Michele Cooper  is a 83 y.o. female who presents with Left hip pain. Patient states that her hip began bothering her about 3-4 days ago. She reached a point where she was unable to bear weight on it safely. She states she did not fall or sustain any trauma to it. Here in the ED imaging shows no fracture but does show an effusion. Patient also has leukocytosis on laboratory evaluation. She was given a dose of antibiotics in the ED and Hospitalists were called for admission and further evaluation.  Hospital course :   1. COPD exacerbation with possible pneumonia, leukocytosis. Patient has a history of reactive airway disease . Improved clinically on Budesonide nebulizers and DuoNeb nebulizers. Low-dose prednisone 20 mg will be given for the next 5 days and discontinue.  patient has received antiiotics  Rocephin and Zithromax to cover possible pneumonia. discharge patient with mouth Augmentin and azithromycin  2. Left hip pain with small effusion probably secondary to hip bursitis and complete evulsion of the left iliopsoas tendon ; MRI of the hip is canceled by orthopedics  but patient got this MRI of the left hip done yesterday which has revealed 1.Transverse fracture  through the third sacral segment. 2. Complete avulsion of the left iliopsoas tendon from its insertion on the left greater trochanter with edema in the adjacent soft tissues.   patient's hip pain is much better , orthopedics recommended to continue by mouth prednisone.  Dr. Ernest Pine will discuss the MRI results with the patient's son prior to discharge PT is recommending skilled nursing facility. Pain management as needed with tramadol  3. History of atrial fibrillation on aspirin. Rate controlled 4. Anxiety on Celexa 5. GERD on Protonix. 6. Anticipate discharge to skilled care today after Dr. Ernest Pine discuss with patient's son   DISCHARGE CONDITIONS:   STABLE  CONSULTS OBTAINED:  Treatment Team:  Carnella Guadalajara, DO Donato Heinz, MD   PROCEDURES  None  DRUG ALLERGIES:   Allergies  Allergen Reactions  . Sulfa Antibiotics Hives    DISCHARGE MEDICATIONS:   Current Discharge Medication List    START taking these medications   Details  acetaminophen (TYLENOL) 325 MG tablet Take 2 tablets (650 mg total) by mouth every 6 (six) hours as needed for mild pain (or Fever >/= 101).    amoxicillin-clavulanate (AUGMENTIN) 875-125 MG tablet Take 1 tablet by mouth every 12 (twelve) hours. Qty: 10 tablet, Refills: 0    azithromycin (ZITHROMAX) 250 MG tablet 1 tablet by mouth once daily starting from tomorrow 11/07/2016 Qty: 3 each, Refills: 0    budesonide (PULMICORT) 0.5 MG/2ML nebulizer solution Take 2 mLs (0.5 mg total) by nebulization 2 (two) times daily. Qty: 50 mL, Refills: 0  ipratropium-albuterol (DUONEB) 0.5-2.5 (3) MG/3ML SOLN Take 3 mLs by nebulization every 6 (six) hours. Qty: 360 mL, Refills: 0    loperamide (IMODIUM) 2 MG capsule Take 1 capsule (2 mg total) by mouth every 2 (two) hours as needed for diarrhea or loose stools (do NOT exceed 16mg  per day). Qty: 30 capsule, Refills: 0    ondansetron (ZOFRAN) 4 MG tablet Take 1 tablet (4 mg total) by mouth every 6 (six) hours as  needed for nausea. Qty: 20 tablet, Refills: 0    predniSONE (DELTASONE) 20 MG tablet Take 1 tablet (20 mg total) by mouth daily with breakfast. Qty: 5 tablet, Refills: 0    traMADol (ULTRAM) 50 MG tablet Take 1 tablet (50 mg total) by mouth every 6 (six) hours as needed for moderate pain. Qty: 30 tablet, Refills: 0      CONTINUE these medications which have NOT CHANGED   Details  aspirin EC 81 MG tablet Take 81 mg by mouth daily.    cholecalciferol (VITAMIN D) 1000 units tablet Take 1,000 Units by mouth daily.    citalopram (CELEXA) 20 MG tablet Take 20 mg by mouth daily.    fluticasone (FLONASE) 50 MCG/ACT nasal spray Place 1 spray into both nostrils daily.    hydrOXYzine (ATARAX/VISTARIL) 25 MG tablet Take 25 mg by mouth every 8 (eight) hours as needed for itching.    hyoscyamine (LEVSIN SL) 0.125 MG SL tablet Place 0.125 mg under the tongue every 4 (four) hours as needed for cramping.    losartan-hydrochlorothiazide (HYZAAR) 100-12.5 MG tablet Take 1 tablet by mouth daily.    montelukast (SINGULAIR) 10 MG tablet Take 10 mg by mouth at bedtime.    Multiple Vitamin (MULTIVITAMIN WITH MINERALS) TABS tablet Take 1 tablet by mouth daily.    pantoprazole (PROTONIX) 40 MG tablet Take 40 mg by mouth 2 (two) times daily before a meal.    propranolol (INDERAL) 20 MG tablet Take 20 mg by mouth daily.      STOP taking these medications     traMADol-acetaminophen (ULTRACET) 37.5-325 MG tablet          DISCHARGE INSTRUCTIONS:   Continue physical therapy at skilled nursing facility   follow-up with primary care physician at the facility in 3 days Outpatient follow-up with the orthopedics in 1 week  DIET:  Regular diet  DISCHARGE CONDITION:  Fair  ACTIVITY:  Activity as tolerated per PT  OXYGEN:  Home Oxygen: No.   Oxygen Delivery: room air  DISCHARGE LOCATION:  nursing home   If you experience worsening of your admission symptoms, develop shortness of breath,  life threatening emergency, suicidal or homicidal thoughts you must seek medical attention immediately by calling 911 or calling your MD immediately  if symptoms less severe.  You Must read complete instructions/literature along with all the possible adverse reactions/side effects for all the Medicines you take and that have been prescribed to you. Take any new Medicines after you have completely understood and accpet all the possible adverse reactions/side effects.   Please note  You were cared for by a hospitalist during your hospital stay. If you have any questions about your discharge medications or the care you received while you were in the hospital after you are discharged, you can call the unit and asked to speak with the hospitalist on call if the hospitalist that took care of you is not available. Once you are discharged, your primary care physician will handle any further medical issues. Please note that  NO REFILLS for any discharge medications will be authorized once you are discharged, as it is imperative that you return to your primary care physician (or establish a relationship with a primary care physician if you do not have one) for your aftercare needs so that they can reassess your need for medications and monitor your lab values.     Today  Chief Complaint  Patient presents with  . Hip Pain    Pt. here via EMS from [facility].   Patient is feeling much better today left hip pain significantly improved son at bedside  ROS:  CONSTITUTIONAL: Denies fevers, chills. Denies any fatigue, weakness.  EYES: Denies blurry vision, double vision, eye pain. EARS, NOSE, THROAT: Denies tinnitus, ear pain, hearing loss. RESPIRATORY: Denies cough, wheeze, shortness of breath.  CARDIOVASCULAR: Denies chest pain, palpitations, edema.  GASTROINTESTINAL: Denies nausea, vomiting, diarrhea, abdominal pain. Denies bright red blood per rectum. GENITOURINARY: Denies dysuria, hematuria. ENDOCRINE:  Denies nocturia or thyroid problems. HEMATOLOGIC AND LYMPHATIC: Denies easy bruising or bleeding. SKIN: Denies rash or lesion. MUSCULOSKELETAL: Denies pain in neck, back, shoulder, knees. Reports improved left hip pain NEUROLOGIC: Denies paralysis, paresthesias.  PSYCHIATRIC: Denies anxiety or depressive symptoms.   VITAL SIGNS:  Blood pressure (!) 152/62, pulse 92, temperature 98.3 F (36.8 C), temperature source Oral, resp. rate 16, height 4\' 11"  (1.499 m), weight 59 kg (130 lb), SpO2 96 %.  I/O:    Intake/Output Summary (Last 24 hours) at 11/06/16 1433 Last data filed at 11/06/16 1351  Gross per 24 hour  Intake              120 ml  Output                0 ml  Net              120 ml    PHYSICAL EXAMINATION:  GENERAL:  83 y.o.-year-old patient lying in the bed with no acute distress.  EYES: Pupils equal, round, reactive to light and accommodation. No scleral icterus. Extraocular muscles intact.  HEENT: Head atraumatic, normocephalic. Oropharynx and nasopharynx clear.  NECK:  Supple, no jugular venous distention. No thyroid enlargement, no tenderness.  LUNGS: Normal breath sounds bilaterally, no wheezing, rales,rhonchi or crepitation. No use of accessory muscles of respiration.  CARDIOVASCULAR: S1, S2 normal. No murmurs, rubs, or gallops.  ABDOMEN: Soft, non-tender, non-distended. Bowel sounds present. No organomegaly or mass.  EXTREMITIES: No pedal edema, cyanosis, or clubbing. Minimal left hip tenderness  NEUROLOGIC: Cranial nerves II through XII are intact. Muscle strength 5/5 in all extremities. Sensation intact. Gait not checked.  PSYCHIATRIC: The patient is alert and oriented x 3.  SKIN: No obvious rash, lesion, or ulcer.   DATA REVIEW:   CBC  Recent Labs Lab 11/04/16 0352  WBC 12.8*  HGB 9.3*  HCT 28.0*  PLT 304    Chemistries   Recent Labs Lab 11/03/16 1939 11/04/16 0352  NA 133* 138  K 4.8 3.8  CL 98* 102  CO2 26 28  GLUCOSE 124* 107*  BUN 33* 31*   CREATININE 1.28* 1.18*  CALCIUM 8.7* 8.5*  AST 45*  --   ALT 18  --   ALKPHOS 77  --   BILITOT 1.3*  --     Cardiac Enzymes  Recent Labs Lab 11/03/16 2149  TROPONINI 0.03*    Microbiology Results  Results for orders placed or performed during the hospital encounter of 11/03/16  Urine culture     Status: None  Collection Time: 11/04/16  4:05 PM  Result Value Ref Range Status   Specimen Description URINE, CATHETERIZED  Final   Special Requests NONE  Final   Culture   Final    NO GROWTH Performed at [facility], [address] [place], [place] [address]    Report Status 11/05/2016 FINAL  Final    RADIOLOGY:  Dg Chest 2 View  Result Date: 11/03/2016 CLINICAL DATA:  Patient if from [facility], she can normally ambulate with a walker but for the last 4 days has been unable to bear weight. She reports no fall. She has been feeling tired.HX afib, gastritis, htn, reactive airway disease, former smoker EXAM: CHEST  2 VIEW COMPARISON:  None. FINDINGS: The cardiac silhouette is mildly enlarged. No mediastinal or hilar masses. No convincing adenopathy. There is lung base opacity most evident posteriorly on the left. This may be atelectasis/ scarring. Consider pneumonia if this correlates clinically. Remainder of the lungs is clear. There is no pulmonary edema. No pleural effusion.  No pneumothorax. Skeletal structures are demineralized but grossly intact. IMPRESSION: 1. Bibasilar lung opacity, greater on the left, likely atelectasis/scarring. Pneumonia is possible. 2. No evidence of pulmonary edema. 3. Mild cardiomegaly. Electronically Signed   By: Amie Portland M.D.   On: 11/03/2016 22:09   Ct Hip Left Wo Contrast  Result Date: 11/03/2016 CLINICAL DATA:  Hip pain EXAM: CT OF THE LEFT HIP WITHOUT CONTRAST TECHNIQUE: Multidetector CT imaging of the left hip was performed according to the standard protocol. Multiplanar CT image reconstructions were also generated. COMPARISON:   Radiographs 11/03/2016 FINDINGS: Bones/Joint/Cartilage No fracture or dislocation is evident. There is no periostitis or bone destruction. A small hip effusion is suspected. Ligaments Suboptimally assessed by CT. Muscles and Tendons Mild fatty atrophy of the left hip musculature.  No obvious masses. Soft tissues Edema and inflammatory change involving the anterior compartment of the left hip. Femoral vascular calcification. Sigmoid colon diverticular disease. IMPRESSION: 1. No acute fracture or malalignment 2. Suspect small left hip effusion. Edema and inflammatory change within the soft tissues of the anterior compartment of the left hip. Findings could be secondary to infectious or inflammatory process. MRI recommended for further evaluation. Electronically Signed   By: Jasmine Pang M.D.   On: 11/03/2016 22:24   Mr Hip Left Wo Contrast  Result Date: 11/05/2016 CLINICAL DATA:  Left hip pain. The patient is unable to bear weight. No known trauma. EXAM: MR OF THE LEFT HIP WITHOUT CONTRAST TECHNIQUE: Multiplanar, multisequence MR imaging was performed. No intravenous contrast was administered. COMPARISON:  Radiographs and CT scan dated 11/03/2016 FINDINGS: Bones: The patient has a transverse fracture through the third sacral segment. Muscles and tendons: There is a complete disruption of the iliopsoas tendon at the level of the head of the left femur. There is edema throughout distal iliopsoas muscle and surrounding the torn tendon. The tendon is avulsed from its insertion on the lesser trochanter. Articular cartilage and labrum Articular cartilage:  The hip joint appears normal. Labrum:  Normal. Joint or bursal effusion Joint effusion:  No joint effusion. Bursae:  Normal. IMPRESSION: 1. Transverse fracture through the third sacral segment. 2. Complete avulsion of the left iliopsoas tendon from its insertion on the left greater trochanter with edema in the adjacent soft tissues. Electronically Signed   By: Francene Boyers M.D.   On: 11/05/2016 14:08   Dg Hip Unilat With Pelvis 2-3 Views Left  Result Date: 11/03/2016 CLINICAL DATA:  Nki; c/o  left hip pain; can't remember how long its been hurting EXAM: DG HIP (WITH OR WITHOUT PELVIS) 2-3V LEFT COMPARISON:  None. FINDINGS: No acute fracture.  No bone lesion. Left hip joint is normally spaced and aligned. No significant arthropathic change. Right hip joint is normally aligned as are the SI joints and the symphysis pubis. Bones are extensively demineralized. Soft tissues are unremarkable. IMPRESSION: 1. No acute fracture, bone lesion or significant left hip joint abnormality. Electronically Signed   By: Amie Portlandavid  Ormond M.D.   On: 11/03/2016 20:47    EKG:   Orders placed or performed during the hospital encounter of 11/03/16  . EKG 12-Lead  . EKG 12-Lead      Management plans discussed with the patient, family and they are in agreement.  CODE STATUS:     Code Status Orders        Start     Ordered   11/04/16 0120  Do not attempt resuscitation (DNR)  Continuous    Question Answer Comment  In the event of cardiac or respiratory ARREST Do not call a "code blue"   In the event of cardiac or respiratory ARREST Do not perform Intubation, CPR, defibrillation or ACLS   In the event of cardiac or respiratory ARREST Use medication by any route, position, wound care, and other measures to relive pain and suffering. May use oxygen, suction and manual treatment of airway obstruction as needed for comfort.      11/04/16 0119    Code Status History    Date Active Date Inactive Code Status Order ID Comments User Context   This patient has a current code status but no historical code status.    Advance Directive Documentation   Flowsheet Row Most Recent Value  Type of Advance Directive  Out of facility DNR (pink MOST or yellow form)  Pre-existing out of facility DNR order (yellow form or pink MOST form)  Yellow form placed in chart (order not valid for  inpatient use)  "MOST" Form in Place?  No data      TOTAL TIME TAKING CARE OF THIS PATIENT: 45  minutes.   Note: This dictation was prepared with Dragon dictation along with smaller phrase technology. Any transcriptional errors that result from this process are unintentional.   @MEC @  on 11/06/2016 at 2:33 PM  Between 7am to 6pm - Pager - [phone]  After 6pm go to [email or web address] - password [email or web address] [facility]  [facility]  Office  [phone]  CC: Primary care physician; Danella PentonMark F Miller, MD

## 2016-11-06 NOTE — Clinical Social Work Placement (Signed)
   CLINICAL SOCIAL WORK PLACEMENT  NOTE  Date:  11/06/2016  Patient Details  Name: [name] MRN: [id number] Date of Birth: 1916-04-29  Clinical Social Work is seeking post-discharge placement for this patient at the Skilled  Nursing Facility level of care (*CSW will initial, date and re-position this form in  chart as items are completed):  Yes   Patient/family provided with [place] Clinical Social Work Department's list of facilities offering this level of care within the geographic area requested by the patient (or if unable, by the patient's family).  Yes   Patient/family informed of their freedom to choose among providers that offer the needed level of care, that participate in Medicare, Medicaid or managed care program needed by the patient, have an available bed and are willing to accept the patient.  Yes   Patient/family informed of [place]'s ownership interest in [facility] and [facility], as well as of the fact that they are under no obligation to receive care at these facilities.  PASRR submitted to EDS on       PASRR number received on       Existing PASRR number confirmed on 11/05/16     FL2 transmitted to all facilities in geographic area requested by pt/family on 11/05/16     FL2 transmitted to all facilities within larger geographic area on       Patient informed that his/her managed care company has contracts with or will negotiate with certain facilities, including the following:        Yes   Patient/family informed of bed offers received.  Patient chooses bed at  [facility])     Physician recommends and patient chooses bed at      Patient to be transferred to  [facility] ) on 11/06/16.  Patient to be transferred to facility by  [facility] )     Patient family notified on 11/06/16 of transfer.  Name of family member notified:   (Patient's son Onalee Hua is at bedside and aware of D/C today. )     PHYSICIAN        Additional Comment:    _______________________________________________ Kesi Perrow, Darleen Crocker, LCSW 11/06/2016, 2:49 PM

## 2016-11-06 NOTE — Progress Notes (Signed)
Physical Therapy Treatment Patient Details Name: Michele Cooper MRN: [id number] DOB: 11/01/1915 Today's Date: 11/06/2016    History of Present Illness Michele Cooper  is a 83 y.o. female who presents with left hip pain. Patient states that her hip began bothering her about 3-4 days ago. She reached a point where she was unable to bear weight on it safely. She states she did not fall or sustain any trauma to it. Here in the ED imaging shows no fracture but does show an effusion. Patient also has leukocytosis on laboratory evaluation. She was given a dose of antibiotics in the ED and Hospitalists were called for admission and further evaluation. Pt has been evaluated by ortho who recommended an MRI. She was cleared for mobility and is WBAT on LLE.     PT Comments    Pt in chair upon arrival.  Initially declined therapy session but agreed to seated exercises with encouragement.  Participated in exercises as described below.  Tolerated exercises well but declined offer for standing/gait trials.   Follow Up Recommendations  SNF     Equipment Recommendations  None recommended by PT    Recommendations for Other Services       Precautions / Restrictions Precautions Precautions: Fall Restrictions Weight Bearing Restrictions: No LLE Weight Bearing: Weight bearing as tolerated    Mobility  Bed Mobility               General bed mobility comments: in chair upon arrival with nursing.    Transfers                 General transfer comment: pt in chair upon arrival.  refused further mobility at this time  Ambulation/Gait             General Gait Details: pt in chair upon arrival, refused further mobility at this time   Stairs            Wheelchair Mobility    Modified Rankin (Stroke Patients Only)       Balance                                    Cognition Arousal/Alertness: Awake/alert Behavior During Therapy: WFL for tasks  assessed/performed Overall Cognitive Status: Within Functional Limits for tasks assessed                      Exercises Other Exercises Other Exercises: seated ankle pumps, LAW, glut squeezes, marches, ab/adduction 2 x 10 for BLE    General Comments        Pertinent Vitals/Pain Pain Assessment: 0-10 Pain Score: 5  Pain Location: L hip  Pain Descriptors / Indicators: Throbbing Pain Intervention(s): Limited activity within patient's tolerance;Monitored during session    Home Living                      Prior Function            PT Goals (current goals can now be found in the care plan section) Progress towards PT goals: Progressing toward goals    Frequency    7X/week      PT Plan Current plan remains appropriate    Co-evaluation             End of Session   Activity Tolerance: Patient limited by fatigue;Patient limited by pain Patient left: in chair;with call bell/phone within  reach;with chair alarm set   Pain - Right/Left: Left Pain - part of body: Hip     Time: 1000-1017 PT Time Calculation (min) (ACUTE ONLY): 17 min  Charges:  $Therapeutic Exercise: 8-22 mins                    G Codes:       Danielle Dess 2016-11-25, 10:48 AM

## 2016-11-06 NOTE — Progress Notes (Signed)
Patient had several bouts of soft stool. MD notified. Order for immodium received

## 2016-11-06 NOTE — Progress Notes (Signed)
Patient is medically stable for D/C to [facility] today. Patient is going under private pay because she is medicare observation. Per Sue LushAndrea admissions coordinator at [facility] patient will go to room 225. RN will call report at [phone] and arrange EMS for transport. Clinical Child psychotherapistocial Worker (CSW) sent D/C orders to [employer] via [employer]. Patient and her son Michele Cooper are aware of above. Per son Dr. Ernest PineHooten called him and answered all his questions and he is in agreement with D/C plan. Please reconsult if future social work needs arise. CSW signing off.   [employer] Hughes IncorporatedBailey Aristides Luckey, LCSW [phone]

## 2016-11-08 DIAGNOSIS — J441 Chronic obstructive pulmonary disease with (acute) exacerbation: Secondary | ICD-10-CM

## 2016-11-08 DIAGNOSIS — S3210XA Unspecified fracture of sacrum, initial encounter for closed fracture: Secondary | ICD-10-CM

## 2016-11-08 DIAGNOSIS — F39 Unspecified mood [affective] disorder: Secondary | ICD-10-CM

## 2016-11-08 DIAGNOSIS — I10 Essential (primary) hypertension: Secondary | ICD-10-CM

## 2016-11-08 DIAGNOSIS — K219 Gastro-esophageal reflux disease without esophagitis: Secondary | ICD-10-CM | POA: Diagnosis not present

## 2016-11-19 DIAGNOSIS — S025XXA Fracture of tooth (traumatic), initial encounter for closed fracture: Secondary | ICD-10-CM

## 2016-12-29 ENCOUNTER — Emergency Department: Payer: Medicare Other

## 2016-12-29 ENCOUNTER — Inpatient Hospital Stay
Admission: EM | Admit: 2016-12-29 | Discharge: 2017-01-01 | DRG: 190 | Disposition: A | Discharge status: Skilled Nursing Facility | Payer: Medicare Other | Attending: Specialist | Admitting: Specialist

## 2016-12-29 DIAGNOSIS — J189 Pneumonia, unspecified organism: Secondary | ICD-10-CM | POA: Diagnosis present

## 2016-12-29 DIAGNOSIS — Z882 Allergy status to sulfonamides status: Secondary | ICD-10-CM

## 2016-12-29 DIAGNOSIS — Z66 Do not resuscitate: Secondary | ICD-10-CM | POA: Diagnosis present

## 2016-12-29 DIAGNOSIS — Z833 Family history of diabetes mellitus: Secondary | ICD-10-CM

## 2016-12-29 DIAGNOSIS — Z7951 Long term (current) use of inhaled steroids: Secondary | ICD-10-CM

## 2016-12-29 DIAGNOSIS — J440 Chronic obstructive pulmonary disease with acute lower respiratory infection: Secondary | ICD-10-CM | POA: Diagnosis present

## 2016-12-29 DIAGNOSIS — R0602 Shortness of breath: Secondary | ICD-10-CM | POA: Diagnosis not present

## 2016-12-29 DIAGNOSIS — Z90710 Acquired absence of both cervix and uterus: Secondary | ICD-10-CM | POA: Diagnosis not present

## 2016-12-29 DIAGNOSIS — Z87891 Personal history of nicotine dependence: Secondary | ICD-10-CM

## 2016-12-29 DIAGNOSIS — K219 Gastro-esophageal reflux disease without esophagitis: Secondary | ICD-10-CM | POA: Diagnosis present

## 2016-12-29 DIAGNOSIS — I4891 Unspecified atrial fibrillation: Secondary | ICD-10-CM | POA: Diagnosis present

## 2016-12-29 DIAGNOSIS — Z792 Long term (current) use of antibiotics: Secondary | ICD-10-CM | POA: Diagnosis not present

## 2016-12-29 DIAGNOSIS — Z79899 Other long term (current) drug therapy: Secondary | ICD-10-CM

## 2016-12-29 DIAGNOSIS — Z8249 Family history of ischemic heart disease and other diseases of the circulatory system: Secondary | ICD-10-CM | POA: Diagnosis not present

## 2016-12-29 DIAGNOSIS — Z7982 Long term (current) use of aspirin: Secondary | ICD-10-CM | POA: Diagnosis not present

## 2016-12-29 DIAGNOSIS — I10 Essential (primary) hypertension: Secondary | ICD-10-CM | POA: Diagnosis present

## 2016-12-29 DIAGNOSIS — J441 Chronic obstructive pulmonary disease with (acute) exacerbation: Secondary | ICD-10-CM | POA: Diagnosis not present

## 2016-12-29 DIAGNOSIS — F329 Major depressive disorder, single episode, unspecified: Secondary | ICD-10-CM | POA: Diagnosis present

## 2016-12-29 DIAGNOSIS — M1990 Unspecified osteoarthritis, unspecified site: Secondary | ICD-10-CM | POA: Diagnosis present

## 2016-12-29 DIAGNOSIS — Z79891 Long term (current) use of opiate analgesic: Secondary | ICD-10-CM | POA: Diagnosis not present

## 2016-12-29 LAB — CBC WITH DIFFERENTIAL/PLATELET
BASOS ABS: 0 10*3/uL (ref 0–0.1)
BASOS PCT: 0 %
EOS ABS: 0.3 10*3/uL (ref 0–0.7)
EOS PCT: 3 %
HEMATOCRIT: 26.7 % — AB (ref 35.0–47.0)
HEMOGLOBIN: 8.9 g/dL — AB (ref 12.0–16.0)
Lymphocytes Relative: 9 %
Lymphs Abs: 1 10*3/uL (ref 1.0–3.6)
MCH: 29.2 pg (ref 26.0–34.0)
MCHC: 33.1 g/dL (ref 32.0–36.0)
MCV: 88.1 fL (ref 80.0–100.0)
Monocytes Absolute: 0.6 10*3/uL (ref 0.2–0.9)
Monocytes Relative: 6 %
Neutro Abs: 9.2 10*3/uL — ABNORMAL HIGH (ref 1.4–6.5)
Neutrophils Relative %: 82 %
Platelets: 381 10*3/uL (ref 150–440)
RBC: 3.04 MIL/uL — AB (ref 3.80–5.20)
RDW: 16.6 % — ABNORMAL HIGH (ref 11.5–14.5)
WBC: 11.1 10*3/uL — AB (ref 3.6–11.0)

## 2016-12-29 LAB — BASIC METABOLIC PANEL
Anion gap: 8 (ref 5–15)
BUN: 15 mg/dL (ref 6–20)
CHLORIDE: 104 mmol/L (ref 101–111)
CO2: 26 mmol/L (ref 22–32)
Calcium: 9 mg/dL (ref 8.9–10.3)
Creatinine, Ser: 0.79 mg/dL (ref 0.44–1.00)
GFR calc Af Amer: >60 mL/min (ref >60)
GLUCOSE: 81 mg/dL (ref 65–99)
POTASSIUM: 4.6 mmol/L (ref 3.5–5.1)
Sodium: 138 mmol/L (ref 135–145)

## 2016-12-29 LAB — TROPONIN I: Troponin I: <0.03 ng/mL (ref <0.03)

## 2016-12-29 MED ORDER — DEXTROSE 5 % IV SOLN
500.0000 mg | INTRAVENOUS | Status: DC
  Administered 2016-12-30 – 2016-12-31 (×2): 500 mg via INTRAVENOUS
  Filled 2016-12-29 (×3): qty 500

## 2016-12-29 MED ORDER — HYOSCYAMINE SULFATE 0.125 MG SL SUBL
0.1250 mg | SUBLINGUAL_TABLET | SUBLINGUAL | Status: DC | PRN
  Filled 2016-12-29: qty 1

## 2016-12-29 MED ORDER — LOSARTAN POTASSIUM 50 MG PO TABS
100.0000 mg | ORAL_TABLET | Freq: Every day | ORAL | Status: DC
  Administered 2016-12-30 – 2017-01-01 (×3): 100 mg via ORAL
  Filled 2016-12-29 (×3): qty 2

## 2016-12-29 MED ORDER — CITALOPRAM HYDROBROMIDE 20 MG PO TABS
20.0000 mg | ORAL_TABLET | Freq: Every day | ORAL | Status: DC
  Administered 2016-12-30 – 2017-01-01 (×3): 20 mg via ORAL
  Filled 2016-12-29 (×3): qty 1

## 2016-12-29 MED ORDER — ASPIRIN EC 81 MG PO TBEC
81.0000 mg | DELAYED_RELEASE_TABLET | Freq: Every day | ORAL | Status: DC
  Administered 2016-12-30 – 2017-01-01 (×3): 81 mg via ORAL
  Filled 2016-12-29 (×3): qty 1

## 2016-12-29 MED ORDER — ACETAMINOPHEN 325 MG PO TABS: 650.0000 mg | ORAL_TABLET | Freq: Four times a day (QID) | ORAL | Status: DC | PRN

## 2016-12-29 MED ORDER — ONDANSETRON HCL 4 MG PO TABS: 4.0000 mg | ORAL_TABLET | Freq: Four times a day (QID) | ORAL | Status: DC | PRN

## 2016-12-29 MED ORDER — SODIUM CHLORIDE 0.9% FLUSH: 3.0000 mL | INTRAVENOUS | Status: DC | PRN

## 2016-12-29 MED ORDER — PANTOPRAZOLE SODIUM 40 MG PO TBEC
40.0000 mg | DELAYED_RELEASE_TABLET | Freq: Two times a day (BID) | ORAL | Status: DC
  Administered 2016-12-29 – 2017-01-01 (×6): 40 mg via ORAL
  Filled 2016-12-29 (×6): qty 1

## 2016-12-29 MED ORDER — ONDANSETRON HCL 4 MG/2ML IJ SOLN: 4.0000 mg | Freq: Four times a day (QID) | INTRAMUSCULAR | Status: DC | PRN

## 2016-12-29 MED ORDER — ENOXAPARIN SODIUM 30 MG/0.3ML ~~LOC~~ SOLN
30.0000 mg | SUBCUTANEOUS | Status: DC
  Administered 2016-12-29 – 2016-12-31 (×3): 30 mg via SUBCUTANEOUS
  Filled 2016-12-29 (×3): qty 0.3

## 2016-12-29 MED ORDER — MONTELUKAST SODIUM 10 MG PO TABS
10.0000 mg | ORAL_TABLET | Freq: Every day | ORAL | Status: DC
  Administered 2016-12-29 – 2016-12-31 (×3): 10 mg via ORAL
  Filled 2016-12-29 (×3): qty 1

## 2016-12-29 MED ORDER — LOSARTAN POTASSIUM-HCTZ 100-12.5 MG PO TABS: 1.0000 | ORAL_TABLET | Freq: Every day | ORAL | Status: DC

## 2016-12-29 MED ORDER — METHYLPREDNISOLONE SODIUM SUCC 125 MG IJ SOLR
125.0000 mg | Freq: Once | INTRAMUSCULAR | Status: AC
  Administered 2016-12-29: 125 mg via INTRAVENOUS
  Filled 2016-12-29: qty 2

## 2016-12-29 MED ORDER — SODIUM CHLORIDE 0.9 % IV SOLN: 250.0000 mL | INTRAVENOUS | Status: DC | PRN

## 2016-12-29 MED ORDER — IPRATROPIUM-ALBUTEROL 0.5-2.5 (3) MG/3ML IN SOLN
3.0000 mL | Freq: Four times a day (QID) | RESPIRATORY_TRACT | Status: DC
  Administered 2016-12-29 – 2016-12-30 (×5): 3 mL via RESPIRATORY_TRACT
  Filled 2016-12-29 (×5): qty 3

## 2016-12-29 MED ORDER — HYDROCHLOROTHIAZIDE 12.5 MG PO CAPS
12.5000 mg | ORAL_CAPSULE | Freq: Every day | ORAL | Status: DC
  Administered 2016-12-30 – 2017-01-01 (×3): 12.5 mg via ORAL
  Filled 2016-12-29 (×3): qty 1

## 2016-12-29 MED ORDER — CEFTRIAXONE SODIUM-DEXTROSE 1-3.74 GM-% IV SOLR
1.0000 g | Freq: Once | INTRAVENOUS | Status: AC
  Administered 2016-12-29: 1 g via INTRAVENOUS
  Filled 2016-12-29: qty 50

## 2016-12-29 MED ORDER — ACETAMINOPHEN 650 MG RE SUPP: 650.0000 mg | Freq: Four times a day (QID) | RECTAL | Status: DC | PRN

## 2016-12-29 MED ORDER — DEXTROSE 5 % IV SOLN
1.0000 g | INTRAVENOUS | Status: DC
  Administered 2016-12-30 – 2016-12-31 (×2): 1 g via INTRAVENOUS
  Filled 2016-12-29 (×3): qty 10

## 2016-12-29 MED ORDER — DEXTROSE 5 % IV SOLN: 1.0000 g | Freq: Once | INTRAVENOUS | Status: DC

## 2016-12-29 MED ORDER — ADULT MULTIVITAMIN W/MINERALS CH
1.0000 | ORAL_TABLET | Freq: Every day | ORAL | Status: DC
  Administered 2016-12-30 – 2017-01-01 (×3): 1 via ORAL
  Filled 2016-12-29 (×3): qty 1

## 2016-12-29 MED ORDER — TRAMADOL HCL 50 MG PO TABS
50.0000 mg | ORAL_TABLET | Freq: Four times a day (QID) | ORAL | Status: DC | PRN
  Administered 2016-12-30 – 2016-12-31 (×2): 50 mg via ORAL
  Filled 2016-12-29 (×2): qty 1

## 2016-12-29 MED ORDER — BUDESONIDE 0.25 MG/2ML IN SUSP
0.2500 mg | Freq: Two times a day (BID) | RESPIRATORY_TRACT | Status: DC
  Administered 2016-12-29 – 2017-01-01 (×6): 0.25 mg via RESPIRATORY_TRACT
  Filled 2016-12-29 (×6): qty 2

## 2016-12-29 MED ORDER — VITAMIN D 1000 UNITS PO TABS
1000.0000 [IU] | ORAL_TABLET | Freq: Every day | ORAL | Status: DC
  Administered 2016-12-30 – 2017-01-01 (×3): 1000 [IU] via ORAL
  Filled 2016-12-29 (×3): qty 1

## 2016-12-29 MED ORDER — TRAMADOL-ACETAMINOPHEN 37.5-325 MG PO TABS
1.0000 | ORAL_TABLET | Freq: Two times a day (BID) | ORAL | Status: DC
  Administered 2016-12-29 – 2017-01-01 (×6): 1 via ORAL
  Filled 2016-12-29 (×6): qty 1

## 2016-12-29 MED ORDER — SODIUM CHLORIDE 0.9% FLUSH
3.0000 mL | Freq: Two times a day (BID) | INTRAVENOUS | Status: DC
  Administered 2016-12-29 – 2017-01-01 (×6): 3 mL via INTRAVENOUS

## 2016-12-29 MED ORDER — METHYLPREDNISOLONE SODIUM SUCC 125 MG IJ SOLR
60.0000 mg | Freq: Four times a day (QID) | INTRAMUSCULAR | Status: DC
  Administered 2016-12-29 – 2016-12-30 (×2): 60 mg via INTRAVENOUS
  Filled 2016-12-29 (×2): qty 2

## 2016-12-29 MED ORDER — LOPERAMIDE HCL 2 MG PO CAPS: 2.0000 mg | ORAL_CAPSULE | ORAL | Status: DC | PRN

## 2016-12-29 MED ORDER — FLUTICASONE PROPIONATE 50 MCG/ACT NA SUSP
1.0000 | Freq: Every day | NASAL | Status: DC | PRN
  Filled 2016-12-29: qty 16

## 2016-12-29 MED ORDER — HYDROXYZINE HCL 50 MG PO TABS: 25.0000 mg | ORAL_TABLET | Freq: Three times a day (TID) | ORAL | Status: DC | PRN

## 2016-12-29 MED ORDER — AZITHROMYCIN 500 MG IV SOLR
500.0000 mg | Freq: Once | INTRAVENOUS | Status: AC
  Administered 2016-12-29: 500 mg via INTRAVENOUS
  Filled 2016-12-29: qty 500

## 2016-12-29 NOTE — H&P (Signed)
[employer] - [place] at [facility]   PATIENT NAME: Michele Cooper    MR#:  [id number]  DATE OF BIRTH:  03/13/1916  DATE OF ADMISSION:  12/29/2016  PRIMARY CARE PHYSICIAN: Danella Penton, MD   REQUESTING/REFERRING PHYSICIAN:   Phineas Semen MD CHIEF COMPLAINT:   Chief Complaint  Patient presents with  . Shortness of Breath    HISTORY OF PRESENT ILLNESS: Michele Cooper  is a 83 y.o. female with a known history of Afib, anxiety, oa Hypertension not on oxygen has chronic sob due to copd presents with worsening sob past one week. Pt in ed had to be placed on oxygen, also noted to have possible PNA. She has productive sputum of whitish color.  PAST MEDICAL HISTORY:   Past Medical History:  Diagnosis Date  . A-fib (HCC)   . Anxiety   . Arthritis   . Gastritis   . Hypertension   . Reactive airway disease     PAST SURGICAL HISTORY: Past Surgical History:  Procedure Laterality Date  . ABDOMINAL HYSTERECTOMY      SOCIAL HISTORY:  Social History  Substance Use Topics  . Smoking status: Former Smoker    Types: Cigarettes  . Smokeless tobacco: Former Neurosurgeon  . Alcohol use 0.6 oz/week    1 Glasses of wine per week    FAMILY HISTORY:  Family History  Problem Relation Age of Onset  . Diabetes Brother   . Heart failure Brother   . Hyperlipidemia Father   . CAD Father     DRUG ALLERGIES:  Allergies  Allergen Reactions  . Sulfa Antibiotics Hives    REVIEW OF SYSTEMS:   CONSTITUTIONAL: No fever, fatigue or weakness.  EYES: No blurred or double vision.  EARS, NOSE, AND THROAT: No tinnitus or ear pain.  RESPIRATORY: + cough, + shortness of breath, wheezing or  No hemoptysis.  CARDIOVASCULAR: No chest pain, orthopnea, edema.  GASTROINTESTINAL: No nausea, vomiting, diarrhea or abdominal pain.  GENITOURINARY: No dysuria, hematuria.  ENDOCRINE: No polyuria, nocturia,  HEMATOLOGY: No anemia, easy bruising or bleeding SKIN: No rash or lesion. MUSCULOSKELETAL:  No joint pain or arthritis.   NEUROLOGIC: No tingling, numbness, weakness.  PSYCHIATRY: No anxiety or depression.   MEDICATIONS AT HOME:  Prior to Admission medications   Medication Sig Start Date End Date Taking? Authorizing Provider  acetaminophen (TYLENOL) 325 MG tablet Take 2 tablets (650 mg total) by mouth every 6 (six) hours as needed for mild pain (or Fever >/= 101). 11/06/16   Ramonita Lab, MD  amoxicillin-clavulanate (AUGMENTIN) 875-125 MG tablet Take 1 tablet by mouth every 12 (twelve) hours. 11/06/16   Ramonita Lab, MD  aspirin EC 81 MG tablet Take 81 mg by mouth daily.    Historical Provider, MD  azithromycin (ZITHROMAX) 250 MG tablet 1 tablet by mouth once daily starting from tomorrow 11/07/2016 11/07/16   Ramonita Lab, MD  budesonide (PULMICORT) 0.5 MG/2ML nebulizer solution Take 2 mLs (0.5 mg total) by nebulization 2 (two) times daily. 11/06/16   Ramonita Lab, MD  cholecalciferol (VITAMIN D) 1000 units tablet Take 1,000 Units by mouth daily.    Historical Provider, MD  citalopram (CELEXA) 20 MG tablet Take 20 mg by mouth daily.    Historical Provider, MD  fluticasone (FLONASE) 50 MCG/ACT nasal spray Place 1 spray into both nostrils daily.    Historical Provider, MD  hydrOXYzine (ATARAX/VISTARIL) 25 MG tablet Take 25 mg by mouth every 8 (eight) hours as needed for itching.  Historical Provider, MD  hyoscyamine (LEVSIN SL) 0.125 MG SL tablet Place 0.125 mg under the tongue every 4 (four) hours as needed for cramping.    Historical Provider, MD  ipratropium-albuterol (DUONEB) 0.5-2.5 (3) MG/3ML SOLN Take 3 mLs by nebulization every 6 (six) hours. 11/06/16   Ramonita Lab, MD  loperamide (IMODIUM) 2 MG capsule Take 1 capsule (2 mg total) by mouth every 2 (two) hours as needed for diarrhea or loose stools (do NOT exceed  per day). 11/06/16   Ramonita Lab, MD  losartan-hydrochlorothiazide (HYZAAR) 100-12.5 MG tablet Take 1 tablet by mouth daily.    Historical Provider, MD  montelukast  (SINGULAIR) 10 MG tablet Take 10 mg by mouth at bedtime.    Historical Provider, MD  Multiple Vitamin (MULTIVITAMIN WITH MINERALS) TABS tablet Take 1 tablet by mouth daily.    Historical Provider, MD  ondansetron (ZOFRAN) 4 MG tablet Take 1 tablet (4 mg total) by mouth every 6 (six) hours as needed for nausea. 11/06/16   Ramonita Lab, MD  pantoprazole (PROTONIX) 40 MG tablet Take 40 mg by mouth 2 (two) times daily before a meal.    Historical Provider, MD  predniSONE (DELTASONE) 20 MG tablet Take 1 tablet (20 mg total) by mouth daily with breakfast. 11/07/16   Ramonita Lab, MD  propranolol (INDERAL) 20 MG tablet Take 20 mg by mouth daily.    Historical Provider, MD  traMADol (ULTRAM) 50 MG tablet Take 1 tablet (50 mg total) by mouth every 6 (six) hours as needed for moderate pain. 11/06/16   Ramonita Lab, MD      PHYSICAL EXAMINATION:   VITAL SIGNS: Blood pressure (!) 162/81, pulse 86, temperature 98.7 F (37.1 C), temperature source Oral, resp. rate (!) 23, height  (1.473 m), weight 130 lb (59 kg), SpO2 98 %.  GENERAL:  83 y.o.-year-old patient lying in the bed with no acute distress.  EYES: Pupils equal, round, reactive to light and accommodation. No scleral icterus. Extraocular muscles intact.  HEENT: Head atraumatic, normocephalic. Oropharynx and nasopharynx clear.  NECK:  Supple, no jugular venous distention. No thyroid enlargement, no tenderness.  LUNGS: decreased bs bilaterally no wheezing or accessory muscle usage CARDIOVASCULAR: S1, S2 normal. No murmurs, rubs, or gallops.  ABDOMEN: Soft, nontender, nondistended. Bowel sounds present. No organomegaly or mass.  EXTREMITIES: No pedal edema, cyanosis, or clubbing.  NEUROLOGIC: Cranial nerves II through XII are intact. Muscle strength 5/5 in all extremities. Sensation intact. Gait not checked.  PSYCHIATRIC: The patient is alert and oriented x 3.  SKIN: No obvious rash, lesion, or ulcer.   LABORATORY PANEL:   CBC  Recent Labs Lab  12/29/16 1236  WBC 11.1*  HGB 8.9*  HCT 26.7*  PLT 381  MCV 88.1  MCH 29.2  MCHC 33.1  RDW 16.6*  LYMPHSABS 1.0  MONOABS 0.6  EOSABS 0.3  BASOSABS 0.0   ------------------------------------------------------------------------------------------------------------------  Chemistries   Recent Labs Lab 12/29/16 1236  NA 138  K 4.6  CL 104  CO2 26  GLUCOSE 81  BUN 15  CREATININE 0.79  CALCIUM 9.0   ------------------------------------------------------------------------------------------------------------------ estimated creatinine clearance is 29.1 mL/min (by C-G formula based on SCr of 0.79 mg/dL). ------------------------------------------------------------------------------------------------------------------ No results for input(s): TSH, T4TOTAL, T3FREE, THYROIDAB in the last 72 hours.  Invalid input(s): FREET3   Coagulation profile No results for input(s): INR, PROTIME in the last 168 hours. ------------------------------------------------------------------------------------------------------------------- No results for input(s): DDIMER in the last 72 hours. -------------------------------------------------------------------------------------------------------------------  Cardiac Enzymes  Recent Labs Lab 12/29/16 1236  TROPONINI <0.03   ------------------------------------------------------------------------------------------------------------------ Invalid input(s): POCBNP  ---------------------------------------------------------------------------------------------------------------  Urinalysis    Component Value Date/Time   COLORURINE YELLOW (A) 11/04/2016 1605   APPEARANCEUR CLEAR (A) 11/04/2016 1605   LABSPEC 1.018 11/04/2016 1605   PHURINE 5.0 11/04/2016 1605   GLUCOSEU NEGATIVE 11/04/2016 1605   HGBUR NEGATIVE 11/04/2016 1605   BILIRUBINUR NEGATIVE 11/04/2016 1605   KETONESUR NEGATIVE 11/04/2016 1605   PROTEINUR NEGATIVE 11/04/2016 1605    NITRITE NEGATIVE 11/04/2016 1605   LEUKOCYTESUR NEGATIVE 11/04/2016 1605     RADIOLOGY: Dg Chest 2 View  Result Date: 12/29/2016 CLINICAL DATA:  shortness of breath this morning - the home health nurse called because the pt started with audible wheezing today and was using her inhaler about every 2 hours without relief. Hx - A-fib, HTN, former smoker. EXAM: CHEST  2 VIEW COMPARISON:  11/03/2016 FINDINGS: Cardiac silhouette is mildly enlarged. No mediastinal or hilar masses. No convincing adenopathy. Small left a minimal right pleural effusions. Additional left lung base opacity may reflect atelectasis or infection. There is a small subtle area of hazy airspace opacity in the right upper lobe, new from the prior exam. There are thickened bronchovascular markings bilaterally. This is similar to the prior study. No pneumothorax. Skeletal structures are diffusely demineralized but grossly intact. IMPRESSION: 1. Patient's cardiomegaly, small left and minimal right pleural effusions bilateral thickened bronchovascular markings. Mild congestive heart failure is suspected. 2. Additional opacity noted at the left lung base with a subtle area of opacity in the right upper lobe may reflect atelectasis. Pneumonia should be considered in the proper clinical setting. Electronically Signed   By: Amie Portland M.D.   On: 12/29/2016 13:20    EKG: Orders placed or performed during the hospital encounter of 11/03/16  . EKG 12-Lead  . EKG 12-Lead  . EKG    IMPRESSION AND PLAN: Pt is 83 y.o  With h/o copd with sob   1. Acute on chronic copd  exaceberation I will treat patient with nebs, iv solumedrol  Possible pna- I will treat with iv ceftriaxone and azithromycin   2. essential hypertension Continue therapy with  Hyzaar and inderal   3.  GERD continue Protonix  4. Code status : DNR confirmed        All the records are reviewed and case discussed with ED provider. Management plans discussed with  the patient, family and they are in agreement.  CODE STATUS: Code Status History    Date Active Date Inactive Code Status Order ID Comments User Context   11/04/2016  1:19 AM 11/06/2016  9:11 PM DNR [id number]  Oralia Manis, MD Inpatient    Questions for Most Recent Historical Code Status (Order [id number])    Question Answer Comment   In the event of cardiac or respiratory ARREST Do not call a "code blue"    In the event of cardiac or respiratory ARREST Do not perform Intubation, CPR, defibrillation or ACLS    In the event of cardiac or respiratory ARREST Use medication by any route, position, wound care, and other measures to relive pain and suffering. May use oxygen, suction and manual treatment of airway obstruction as needed for comfort.         Advance Directive Documentation     Most Recent Value  Type of Advance Directive  Out of facility DNR (pink MOST or yellow form)  Pre-existing out of facility DNR order (yellow form or pink MOST form)  -  "MOST" Form in Place?  -  TOTAL TIME TAKING CARE OF THIS PATIENT9minutes.    Auburn Bilberry M.D on 12/29/2016 at 2:46 PM  Between 7am to 6pm - Pager - [phone]  After 6pm go to [email or web address] - password [email or web address]  [facility]  Office  [phone]  CC: Primary care physician; Danella Penton, MD

## 2016-12-29 NOTE — ED Provider Notes (Signed)
[facility] Emergency Department Provider Note   ____________________________________________   I have reviewed the triage vital signs and the nursing notes.   HISTORY  Chief Complaint Shortness of Breath   History limited by: Not Limited   HPI Michele Cooper is a 83 y.o. female who presents to the emergency department today because of concerns for shortness of breath. Patient has a history of COPD. She states that her shortness breath comes and goes. When asked why she called 911 today she states that she did not and that was the nurse. She did receive a DuoNeb by EMS and states that it helped. She denied any associated chest pain. Denies any associated fevers. No cough.    Past Medical History:  Diagnosis Date  . A-fib (HCC)   . Anxiety   . Arthritis   . Gastritis   . Hypertension   . Reactive airway disease     Patient Active Problem List   Diagnosis Date Noted  . Effusion of left hip 11/04/2016  . Leukocytosis 11/04/2016  . Anxiety 11/04/2016  . HTN (hypertension) 11/04/2016  . Pressure injury of skin 11/04/2016    Past Surgical History:  Procedure Laterality Date  . ABDOMINAL HYSTERECTOMY      Prior to Admission medications   Medication Sig Start Date End Date Taking? Authorizing Provider  acetaminophen (TYLENOL) 325 MG tablet Take 2 tablets (650 mg total) by mouth every 6 (six) hours as needed for mild pain (or Fever >/= 101). 11/06/16   Ramonita Lab, MD  amoxicillin-clavulanate (AUGMENTIN) 875-125 MG tablet Take 1 tablet by mouth every 12 (twelve) hours. 11/06/16   Ramonita Lab, MD  aspirin EC 81 MG tablet Take 81 mg by mouth daily.    Historical Provider, MD  azithromycin (ZITHROMAX) 250 MG tablet 1 tablet by mouth once daily starting from tomorrow 11/07/2016 11/07/16   Ramonita Lab, MD  budesonide (PULMICORT) 0.5 MG/2ML nebulizer solution Take 2 mLs (0.5 mg total) by nebulization 2 (two) times daily. 11/06/16   Ramonita Lab, MD   cholecalciferol (VITAMIN D) 1000 units tablet Take 1,000 Units by mouth daily.    Historical Provider, MD  citalopram (CELEXA) 20 MG tablet Take 20 mg by mouth daily.    Historical Provider, MD  fluticasone (FLONASE) 50 MCG/ACT nasal spray Place 1 spray into both nostrils daily.    Historical Provider, MD  hydrOXYzine (ATARAX/VISTARIL) 25 MG tablet Take 25 mg by mouth every 8 (eight) hours as needed for itching.    Historical Provider, MD  hyoscyamine (LEVSIN SL) 0.125 MG SL tablet Place 0.125 mg under the tongue every 4 (four) hours as needed for cramping.    Historical Provider, MD  ipratropium-albuterol (DUONEB) 0.5-2.5 (3) MG/3ML SOLN Take 3 mLs by nebulization every 6 (six) hours. 11/06/16   Ramonita Lab, MD  loperamide (IMODIUM) 2 MG capsule Take 1 capsule (2 mg total) by mouth every 2 (two) hours as needed for diarrhea or loose stools (do NOT exceed  per day). 11/06/16   Ramonita Lab, MD  losartan-hydrochlorothiazide (HYZAAR) 100-12.5 MG tablet Take 1 tablet by mouth daily.    Historical Provider, MD  montelukast (SINGULAIR) 10 MG tablet Take 10 mg by mouth at bedtime.    Historical Provider, MD  Multiple Vitamin (MULTIVITAMIN WITH MINERALS) TABS tablet Take 1 tablet by mouth daily.    Historical Provider, MD  ondansetron (ZOFRAN) 4 MG tablet Take 1 tablet (4 mg total) by mouth every 6 (six) hours as needed for nausea. 11/06/16  Ramonita Lab, MD  pantoprazole (PROTONIX) 40 MG tablet Take 40 mg by mouth 2 (two) times daily before a meal.    Historical Provider, MD  predniSONE (DELTASONE) 20 MG tablet Take 1 tablet (20 mg total) by mouth daily with breakfast. 11/07/16   Ramonita Lab, MD  propranolol (INDERAL) 20 MG tablet Take 20 mg by mouth daily.    Historical Provider, MD  traMADol (ULTRAM) 50 MG tablet Take 1 tablet (50 mg total) by mouth every 6 (six) hours as needed for moderate pain. 11/06/16   Ramonita Lab, MD    Allergies Sulfa antibiotics  Family History  Problem Relation Age of  Onset  . Diabetes Brother   . Heart failure Brother   . Hyperlipidemia Father   . CAD Father     Social History Social History  Substance Use Topics  . Smoking status: Former Smoker    Types: Cigarettes  . Smokeless tobacco: Former Neurosurgeon  . Alcohol use 0.6 oz/week    1 Glasses of wine per week    Review of Systems  Constitutional: Negative for fever. Cardiovascular: Negative for chest pain. Respiratory: Positive for shortness of breath. Gastrointestinal: Negative for abdominal pain, vomiting and diarrhea. Neurological: Negative for headaches, focal weakness or numbness.  10-point ROS otherwise negative.  ____________________________________________   PHYSICAL EXAM:  VITAL SIGNS: ED Triage Vitals  Enc Vitals Group     BP 12/29/16 1231 (!) 162/81     Pulse Rate 12/29/16 1231 86     Resp 12/29/16 1231 (!) 23     Temp 12/29/16 1231 98.7 F (37.1 C)     Temp Source 12/29/16 1231 Oral     SpO2 12/29/16 1227 95 %     Weight 12/29/16 1227 130 lb (59 kg)     Height 12/29/16 1227  (1.473 m)     Head Circumference --      Peak Flow --      Pain Score 12/29/16 1227 0    Constitutional: Alert and oriented. Well appearing and in no distress. Eyes: Conjunctivae are normal. Normal extraocular movements. ENT   Head: Normocephalic and atraumatic.   Nose: No congestion/rhinnorhea.   Mouth/Throat: Mucous membranes are moist.   Neck: No stridor. Hematological/Lymphatic/Immunilogical: No cervical lymphadenopathy. Cardiovascular: Normal rate, regular rhythm.  No murmurs, rubs, or gallops.  Respiratory: Normal respiratory effort without tachypnea nor retractions. Breath sounds are clear and equal bilaterally. No wheezes/rales/rhonchi. Gastrointestinal: Soft and non tender. No rebound. No guarding.  Genitourinary: Deferred Musculoskeletal: Normal range of motion in all extremities. No lower extremity edema. Neurologic:  Normal speech and language. No gross focal  neurologic deficits are appreciated.  Skin:  Skin is warm, dry and intact. No rash noted. Psychiatric: Mood and affect are normal. Speech and behavior are normal. Patient exhibits appropriate insight and judgment.  ____________________________________________    LABS (pertinent positives/negatives)  Labs Reviewed  CBC WITH DIFFERENTIAL/PLATELET - Abnormal; Notable for the following:       Result Value   WBC 11.1 (*)    RBC 3.04 (*)    Hemoglobin 8.9 (*)    HCT 26.7 (*)    RDW 16.6 (*)    Neutro Abs 9.2 (*)    All other components within normal limits  CBC - Abnormal; Notable for the following:    RBC 2.75 (*)    Hemoglobin 7.9 (*)    HCT 23.7 (*)    RDW 16.2 (*)    All other components within normal limits  BASIC  METABOLIC PANEL - Abnormal; Notable for the following:    Glucose, Bld 142 (*)    Calcium 8.6 (*)    GFR calc non Af Amer 59 (*)    All other components within normal limits  BASIC METABOLIC PANEL  TROPONIN I     ____________________________________________   EKG  I, Phineas Semen, attending physician, personally viewed and interpreted this EKG  EKG Time: 1231 Rate: 85 Rhythm: normal sinus rhythm Axis: normal Intervals: qtc 450 QRS: narrow ST changes: no st elevation Impression: normal ekg  ____________________________________________    RADIOLOGY  CXR IMPRESSION:  1. Patient's cardiomegaly, small left and minimal right pleural  effusions bilateral thickened bronchovascular markings. Mild  congestive heart failure is suspected.  2. Additional opacity noted at the left lung base with a subtle area  of opacity in the right upper lobe may reflect atelectasis.  Pneumonia should be considered in the proper clinical setting.      ____________________________________________   PROCEDURES  Procedures  ____________________________________________   INITIAL IMPRESSION / ASSESSMENT AND PLAN / ED COURSE  Pertinent labs & imaging results  that were available during my care of the patient were reviewed by me and considered in my medical decision making (see chart for details).  Patient presented to the emergency department today because of concerns for shortness of breath. Patient does have a history of COPD he did have some wheezing initially. The patient chest x-ray is concerning for possible pneumonia. Given these findings patient will be admitted to the hospital service.  ____________________________________________   FINAL CLINICAL IMPRESSION(S) / ED DIAGNOSES  Final diagnoses:  COPD exacerbation (HCC)  SOB (shortness of breath)     Note: This dictation was prepared with Dragon dictation. Any transcriptional errors that result from this process are unintentional     Phineas Semen, MD 12/30/16 [phone]

## 2016-12-29 NOTE — ED Triage Notes (Signed)
Pt arrived via ems for c/o shortness of breath - the home health nurse called because the pt started with audible wheezing today and was using her inhaler about every 2 hours without relief - ems place pt on 2L via n/c and gave breathing treatment - wheezing decreased some after treatment

## 2016-12-29 NOTE — ED Notes (Signed)
Pt arrived via ems for c/o shortness of breath - the home health nurse called because the pt started with audible wheezing today and was using her inhaler about every 2 hours without relief - ems place pt on 2L via n/c and gave breathing treatment - wheezing decreased some after treatment 

## 2016-12-30 LAB — CBC
HCT: 23.7 % — ABNORMAL LOW (ref 35.0–47.0)
HEMOGLOBIN: 7.9 g/dL — AB (ref 12.0–16.0)
MCH: 28.6 pg (ref 26.0–34.0)
MCHC: 33.2 g/dL (ref 32.0–36.0)
MCV: 86.2 fL (ref 80.0–100.0)
Platelets: 335 10*3/uL (ref 150–440)
RBC: 2.75 MIL/uL — ABNORMAL LOW (ref 3.80–5.20)
RDW: 16.2 % — AB (ref 11.5–14.5)
WBC: 6.9 10*3/uL (ref 3.6–11.0)

## 2016-12-30 LAB — BASIC METABOLIC PANEL
Anion gap: 8 (ref 5–15)
BUN: 17 mg/dL (ref 6–20)
CALCIUM: 8.6 mg/dL — AB (ref 8.9–10.3)
CHLORIDE: 104 mmol/L (ref 101–111)
CO2: 27 mmol/L (ref 22–32)
CREATININE: 0.8 mg/dL (ref 0.44–1.00)
GFR calc Af Amer: >60 mL/min (ref >60)
GFR calc non Af Amer: 59 mL/min — ABNORMAL LOW (ref >60)
Glucose, Bld: 142 mg/dL — ABNORMAL HIGH (ref 65–99)
Potassium: 3.9 mmol/L (ref 3.5–5.1)
SODIUM: 139 mmol/L (ref 135–145)

## 2016-12-30 MED ORDER — METHYLPREDNISOLONE SODIUM SUCC 125 MG IJ SOLR
60.0000 mg | Freq: Two times a day (BID) | INTRAMUSCULAR | Status: DC
  Administered 2016-12-30 – 2016-12-31 (×2): 60 mg via INTRAVENOUS
  Filled 2016-12-30 (×2): qty 2

## 2016-12-30 NOTE — Progress Notes (Signed)
PT Cancellation Note  Patient Details Name: Michele Cooper MRN: [id number] DOB: [birth year]   Cancelled Treatment:    Reason Eval/Treat Not Completed: Patient declined, no reason specified. Patient reported that she didn't feel up to a PT evaluation today and would like for [place] to check back tomorrow. Will check back when patient is ready to participate.    Katherina Right Samael Blades 12/30/2016, 11:43 AM

## 2016-12-30 NOTE — Progress Notes (Signed)
[facility] - [place] at [facility]   PATIENT NAME: Michele Cooper    MR#:  [id number]  DATE OF BIRTH:  [birth year]  SUBJECTIVE:   Pt. Here due to shortness of breath from COPD Exacerbation/Pneumonia.  Feels a bit better today.  Shortness of breath improved.   REVIEW OF SYSTEMS:    Review of Systems  Constitutional: Negative for chills and fever.  HENT: Negative for congestion and tinnitus.   Eyes: Negative for blurred vision and double vision.  Respiratory: Positive for cough and shortness of breath. Negative for wheezing.   Cardiovascular: Negative for chest pain, orthopnea and PND.  Gastrointestinal: Negative for abdominal pain, diarrhea, nausea and vomiting.  Genitourinary: Negative for dysuria and hematuria.  Neurological: Positive for weakness (Generalized). Negative for dizziness, sensory change and focal weakness.  All other systems reviewed and are negative.   Nutrition: Heart Healthy Tolerating Diet: Yes Tolerating PT: Await Eval.   DRUG ALLERGIES:   Allergies  Allergen Reactions  . Sulfa Antibiotics Hives    VITALS:  Blood pressure (!) 152/84, pulse 85, temperature 97.8 F (36.6 C), temperature source Oral, resp. rate 16, height  (1.473 m), weight 59 kg (130 lb), SpO2 98 %.  PHYSICAL EXAMINATION:   Physical Exam  GENERAL:  83 y.o.-year-old patient lying in bed in no acute distress.  EYES: Pupils equal, round, reactive to light and accommodation. No scleral icterus. Extraocular muscles intact.  HEENT: Head atraumatic, normocephalic. Oropharynx and nasopharynx clear.  NECK:  Supple, no jugular venous distention. No thyroid enlargement, no tenderness.  LUNGS: Prolonged Insp. & Exp. Phase, no wheezing, rales, rhonchi. No use of accessory muscles of respiration.  CARDIOVASCULAR: S1, S2 normal. No murmurs, rubs, or gallops.  ABDOMEN: Soft, nontender, nondistended. Bowel sounds present. No organomegaly or mass.  EXTREMITIES: No cyanosis,  clubbing or edema b/l.    NEUROLOGIC: Cranial nerves II through XII are intact. No focal Motor or sensory deficits b/l. Globally weak.    PSYCHIATRIC: The patient is alert and oriented x 3.  SKIN: No obvious rash, lesion, or ulcer.    LABORATORY PANEL:   CBC  Recent Labs Lab 12/30/16 0318  WBC 6.9  HGB 7.9*  HCT 23.7*  PLT 335   ------------------------------------------------------------------------------------------------------------------  Chemistries   Recent Labs Lab 12/30/16 0318  NA 139  K 3.9  CL 104  CO2 27  GLUCOSE 142*  BUN 17  CREATININE 0.80  CALCIUM 8.6*   ------------------------------------------------------------------------------------------------------------------  Cardiac Enzymes  Recent Labs Lab 12/29/16 1236  TROPONINI <0.03   ------------------------------------------------------------------------------------------------------------------  RADIOLOGY:  Dg Chest 2 View  Result Date: 12/29/2016 CLINICAL DATA:  shortness of breath this morning - the home health nurse called because the pt started with audible wheezing today and was using her inhaler about every 2 hours without relief. Hx - A-fib, HTN, former smoker. EXAM: CHEST  2 VIEW COMPARISON:  11/03/2016 FINDINGS: Cardiac silhouette is mildly enlarged. No mediastinal or hilar masses. No convincing adenopathy. Small left a minimal right pleural effusions. Additional left lung base opacity may reflect atelectasis or infection. There is a small subtle area of hazy airspace opacity in the right upper lobe, new from the prior exam. There are thickened bronchovascular markings bilaterally. This is similar to the prior study. No pneumothorax. Skeletal structures are diffusely demineralized but grossly intact. IMPRESSION: 1. Patient's cardiomegaly, small left and minimal right pleural effusions bilateral thickened bronchovascular markings. Mild congestive heart failure is suspected. 2. Additional  opacity noted at the left lung base with  a subtle area of opacity in the right upper lobe may reflect atelectasis. Pneumonia should be considered in the proper clinical setting. Electronically Signed   By: Amie Portland M.D.   On: 12/29/2016 13:20     ASSESSMENT AND PLAN:   83 year old female with past medical history of COPD, hypertension, osteoarthritis, anxiety, atrial fibrillation who presented to the hospital due to shortness of breath.  1. COPD exacerbation-secondary to pneumonia. -Continue IV steroids, but will taper as wheezing and bronchus spasm improved. Continue scheduled DuoNeb's, Pulmicort nebs, continue empiric IV antibiotics with ceftriaxone and Zithromax. -Assess for home oxygen prior to discharge.  2. Pneumonia/community-acquired pneumonia. -Continue IV ceftriaxone, Zithromax. Follow blood, sputum cultures.  3. Essential hypertension-continue losartan/HCTZ.  4. GERD-continue Protonix.  5. Depression-continue Celexa.   All the records are reviewed and case discussed with Care Management/Social Worker. Management plans discussed with the patient, family and they are in agreement.  CODE STATUS: DO NOT RESUSCITATE  DVT Prophylaxis: Lovenox  TOTAL TIME TAKING CARE OF THIS PATIENT: 30 minutes.   POSSIBLE D/C IN 1-2 DAYS, DEPENDING ON CLINICAL CONDITION.   Houston Siren M.D on 12/30/2016 at 12:30 PM  Between 7am to 6pm - Pager - [phone]  After 6pm go to [email or web address] - Social research officer, government  [facility] [place] [facility]  Office  [phone]  CC: Primary care physician; Danella Penton, MD

## 2016-12-31 LAB — CBC
HCT: 24.9 % — ABNORMAL LOW (ref 35.0–47.0)
Hemoglobin: 8 g/dL — ABNORMAL LOW (ref 12.0–16.0)
MCH: 27.9 pg (ref 26.0–34.0)
MCHC: 32.1 g/dL (ref 32.0–36.0)
MCV: 87 fL (ref 80.0–100.0)
PLATELETS: 354 10*3/uL (ref 150–440)
RBC: 2.86 MIL/uL — ABNORMAL LOW (ref 3.80–5.20)
RDW: 16.5 % — AB (ref 11.5–14.5)
WBC: 12.4 10*3/uL — AB (ref 3.6–11.0)

## 2016-12-31 MED ORDER — METHYLPREDNISOLONE SODIUM SUCC 125 MG IJ SOLR
60.0000 mg | Freq: Every day | INTRAMUSCULAR | Status: DC
  Administered 2017-01-01: 60 mg via INTRAVENOUS
  Filled 2016-12-31: qty 2

## 2016-12-31 MED ORDER — GUAIFENESIN ER 600 MG PO TB12
600.0000 mg | ORAL_TABLET | Freq: Two times a day (BID) | ORAL | Status: DC
  Administered 2016-12-31 – 2017-01-01 (×3): 600 mg via ORAL
  Filled 2016-12-31 (×3): qty 1

## 2016-12-31 MED ORDER — GUAIFENESIN ER 600 MG PO TB12: 600.0000 mg | ORAL_TABLET | Freq: Two times a day (BID) | ORAL | Status: DC

## 2016-12-31 MED ORDER — IPRATROPIUM-ALBUTEROL 0.5-2.5 (3) MG/3ML IN SOLN
3.0000 mL | Freq: Three times a day (TID) | RESPIRATORY_TRACT | Status: DC
  Administered 2016-12-31 – 2017-01-01 (×5): 3 mL via RESPIRATORY_TRACT
  Filled 2016-12-31 (×5): qty 3

## 2016-12-31 MED ORDER — IPRATROPIUM-ALBUTEROL 0.5-2.5 (3) MG/3ML IN SOLN: 3.0000 mL | Freq: Four times a day (QID) | RESPIRATORY_TRACT | Status: DC | PRN

## 2016-12-31 NOTE — Evaluation (Addendum)
Physical Therapy Evaluation Patient Details Name: Michele Cooper MRN: [id number] DOB: 19-Jul-1916 Today's Date: 12/31/2016   History of Present Illness  Michele Cooper is a 83 y.o. female with a known history of Afib, anxiety, oa Hypertension not on oxygen has chronic sob due to copd presents with worsening sob past one week. Pt admitted with Acute on chronic COPD exacerbation and PNA.  Clinical Impression  Pt is a pleasant 83y/o female presenting to hospital with acute on chronic COPD and PNA. Pt's aide from [facility], K[place], present during eval. Pt currently in IND living at [facility] but is awaiting bed at their total care center per [place]. Pt with limited amb. At home with rollator and required assist at home prior to admission. Pt currently undergoing PT at [facility] and has brace ordered to address L foot supination in closed and open chain activities. Pt required assist during all bed mobility and txfs 2/2 weakness and fatigue. SaO2 on 2L at rest >/95%. Ambulation not attempted 2/2 decrease in SaO2 to 77-87% during standing and SOB. RN not available when PT called regarding SaO2 decrease but CNA informed and stated she would inform RN. PT will re-attempt to contact RN regarding SaO2 decrease and speak with case manager regarding placement. Pt would benefit from skilled PT to address above deficits and promote optimal return to PLOF.     Follow Up Recommendations SNF    Equipment Recommendations  Rolling walker with 5" wheels    Recommendations for Other Services       Precautions / Restrictions Precautions Precautions: Fall;Other (comment) (Pt on 2L of O2) Precaution Comments: Pt's SaO2 on 2L of SpO2 decreased to 77-87% during standing. Required Braces or Orthoses: Other Brace/Splint Other Brace/Splint: Pt pt's Nursing Tech, pt currently in PT at [facility] and has brace ordered for L foot (supination in open and closed chain activities). Restrictions Weight Bearing Restrictions:  No      Mobility  Bed Mobility Overal bed mobility: Needs Assistance Bed Mobility: Rolling;Supine to Sit;Sit to Supine Rolling: Min assist   Supine to sit: Min assist Sit to supine: Mod assist   General bed mobility comments: Pt required min A during R and L rolling with UE on handrail in order for PT and tech to reposition pads. Pt required min A during supine to sit to guide trunk into seated position and to decr. posterior trunk lean. Pt required mod A during sit to supine to guide trunk and BLEs into bed and +2 assist to slide towards HOB at end of session.  Transfers Overall transfer level: Needs assistance Equipment used: Rolling walker (2 wheeled) Transfers: Sit to/from Stand Sit to Stand: Min guard;Min assist         General transfer comment: Pt regressed from min guard to min A during 5th rep of STS 2/2 fatigue and SOB. SaO2 on 2L of SpO2 decr. to 77-87%. Pt required shoes placed on feet 2/2 pt stating she has foot pain when standing in hospital socks and 2/2 L foot supination.  Ambulation/Gait Ambulation/Gait assistance:  (Not attempted 2/2 SaO2 dropping in standing)              Stairs            Wheelchair Mobility    Modified Rankin (Stroke Patients Only)       Balance  Pertinent Vitals/Pain Pain Assessment: 0-10 Pain Score: 5  Pain Location: Neck and Back Pain Descriptors / Indicators: Aching Pain Intervention(s): Repositioned;Monitored during session    Home Living Family/patient expects to be discharged to:: Other (Comment) Living Arrangements: Other (Comment);Alone             Home Equipment: Walker - 4 wheels;Wheelchair - manual;Cane - single point;Other (comment);Grab bars - toilet Additional Comments: Pt lives in IND living at [facility] and is awaiting bed a [facility] total care facility per [employer], Risk manager.    Prior Function Level of Independence: Needs  assistance   Gait / Transfers Assistance Needed: Ambulates with rollator limited distances  ADL's / Homemaking Assistance Needed: Assist from [facility] aids with ADLs/IADLs. Makes frozen meals or orders meals from facility        Hand Dominance   Dominant Hand: Right    Extremity/Trunk Assessment   Upper Extremity Assessment Upper Extremity Assessment: Generalized weakness    Lower Extremity Assessment Lower Extremity Assessment: RLE deficits/detail;LLE deficits/detail RLE Deficits / Details: R hip flex: 4/5, knee ext: 4/5, knee flex: 4/5, ankle DF: 4/5. no c/o N/T LLE Deficits / Details: L hip flex: 3+/5, knee ext: 3+/5, knee flex: 4/5,ankle DF 2/5 (limited AROM)-pt with L foot in supinated position during closed and open chain activities, PT at [facility] has ordered brace for pt. no c/o N/T       Communication   Communication: No difficulties  Cognition Arousal/Alertness: Awake/alert Behavior During Therapy: WFL for tasks assessed/performed Overall Cognitive Status: Within Functional Limits for tasks assessed                                 General Comments: Pt did need assist in orientation to place and why she was in hospital      General Comments      Exercises     Assessment/Plan    PT Assessment Patient needs continued PT services  PT Problem List Decreased strength;Decreased range of motion;Decreased activity tolerance;Decreased balance;Decreased mobility;Decreased knowledge of use of DME;Decreased safety awareness;Pain       PT Treatment Interventions DME instruction;Gait training;Functional mobility training;Therapeutic activities;Therapeutic exercise;Balance training;Neuromuscular re-education;Patient/family education;Manual techniques    PT Goals (Current goals can be found in the Care Plan section)  Acute Rehab PT Goals Patient Stated Goal: To feel better PT Goal Formulation: With patient Time For Goal Achievement: 01/14/17 Potential to  Achieve Goals: Good    Frequency Min 2X/week   Barriers to discharge        Co-evaluation               End of Session Equipment Utilized During Treatment: Gait belt;Oxygen (2L) Activity Tolerance: Treatment limited secondary to medical complications (Comment) (decr. SaO2) Patient left: in bed;with call bell/phone within reach;with bed alarm set;with nursing/sitter in room Marchelle Folks (CNA) stated she would inform RN about SaO2 drop) Nurse Communication: Other (comment) (Nurse unavailable but CNA informed about SaO2 drop) PT Visit Diagnosis: Unsteadiness on feet (R26.81);Muscle weakness (generalized) (M62.81);Other abnormalities of gait and mobility (R26.89)    Time: 1610-9604 PT Time Calculation (min) (ACUTE ONLY): 36 min   Charges:   PT Evaluation $PT Eval Moderate Complexity: 1 Procedure PT Treatments $Therapeutic Activity: 8-22 mins   PT G Codes:        Zerita Boers, PT,DPT 12/31/16 11:29 AM     Cortlan Dolin L 12/31/2016, 11:22 AM

## 2016-12-31 NOTE — Clinical Social Work Placement (Signed)
   CLINICAL SOCIAL WORK PLACEMENT  NOTE  Date:  12/31/2016  Patient Details  Name: Michele Cooper MRN: [id number] Date of Birth: [birth year]  Clinical Social Work is seeking post-discharge placement for this patient at the Skilled  Nursing Facility level of care (*CSW will initial, date and re-position this form in  chart as items are completed):  Yes   Patient/family provided with [place] Clinical Social Work Department's list of facilities offering this level of care within the geographic area requested by the patient (or if unable, by the patient's family).  Yes   Patient/family informed of their freedom to choose among providers that offer the needed level of care, that participate in Medicare, Medicaid or managed care program needed by the patient, have an available bed and are willing to accept the patient.  Yes   Patient/family informed of [place]'s ownership interest in [facility] and [facility], as well as of the fact that they are under no obligation to receive care at these facilities.  PASRR submitted to EDS on       PASRR number received on       Existing PASRR number confirmed on 12/31/16     FL2 transmitted to all facilities in geographic area requested by pt/family on 12/31/16     FL2 transmitted to all facilities within larger geographic area on       Patient informed that his/her managed care company has contracts with or will negotiate with certain facilities, including the following:        Yes   Patient/family informed of bed offers received.  Patient chooses bed at  [facility] (SNF) )     Physician recommends and patient chooses bed at      Patient to be transferred to   on  .  Patient to be transferred to facility by       Patient family notified on   of transfer.  Name of family member notified:        PHYSICIAN       Additional Comment:    _______________________________________________ Justn Quale, Darleen Crocker, LCSW 12/31/2016,  4:11 PM

## 2016-12-31 NOTE — Progress Notes (Signed)
[facility] - [place] at [facility]   PATIENT NAME: Michele Cooper    MR#:  [id number]  DATE OF BIRTH:  06-Jan-1916  SUBJECTIVE:   Pt. Here due to shortness of breath from COPD Exacerbation/Pneumonia.  Shortness of breath improved.  No other acute events overnight.   REVIEW OF SYSTEMS:    Review of Systems  Constitutional: Negative for chills and fever.  HENT: Negative for congestion and tinnitus.   Eyes: Negative for blurred vision and double vision.  Respiratory: Positive for cough and shortness of breath. Negative for wheezing.   Cardiovascular: Negative for chest pain, orthopnea and PND.  Gastrointestinal: Negative for abdominal pain, diarrhea, nausea and vomiting.  Genitourinary: Negative for dysuria and hematuria.  Neurological: Positive for weakness (Generalized). Negative for dizziness, sensory change and focal weakness.  All other systems reviewed and are negative.   Nutrition: Heart Healthy Tolerating Diet: Yes Tolerating PT: Await Eval.   DRUG ALLERGIES:   Allergies  Allergen Reactions  . Sulfa Antibiotics Hives    VITALS:  Blood pressure (!) 152/68, pulse 80, temperature 98.3 F (36.8 C), temperature source Oral, resp. rate 18, height  (1.473 m), weight 59 kg (130 lb), SpO2 100 %.  PHYSICAL EXAMINATION:   Physical Exam  GENERAL:  83 y.o.-year-old patient lying in bed in no acute distress.  EYES: Pupils equal, round, reactive to light and accommodation. No scleral icterus. Extraocular muscles intact.  HEENT: Head atraumatic, normocephalic. Oropharynx and nasopharynx clear.  NECK:  Supple, no jugular venous distention. No thyroid enlargement, no tenderness.  LUNGS: Prolonged Insp. & Exp. Phase, no wheezing, rales, rhonchi. No use of accessory muscles of respiration.  CARDIOVASCULAR: S1, S2 normal. No murmurs, rubs, or gallops.  ABDOMEN: Soft, nontender, nondistended. Bowel sounds present. No organomegaly or mass.  EXTREMITIES: No  cyanosis, clubbing or edema b/l.    NEUROLOGIC: Cranial nerves II through XII are intact. No focal Motor or sensory deficits b/l. Globally weak.    PSYCHIATRIC: The patient is alert and oriented x 3.  SKIN: No obvious rash, lesion, or ulcer.    LABORATORY PANEL:   CBC  Recent Labs Lab 12/31/16 0414  WBC 12.4*  HGB 8.0*  HCT 24.9*  PLT 354   ------------------------------------------------------------------------------------------------------------------  Chemistries   Recent Labs Lab 12/30/16 0318  NA 139  K 3.9  CL 104  CO2 27  GLUCOSE 142*  BUN 17  CREATININE 0.80  CALCIUM 8.6*   ------------------------------------------------------------------------------------------------------------------  Cardiac Enzymes  Recent Labs Lab 12/29/16 1236  TROPONINI <0.03   ------------------------------------------------------------------------------------------------------------------  RADIOLOGY:  No results found.   ASSESSMENT AND PLAN:   83 year old female with past medical history of COPD, hypertension, osteoarthritis, anxiety, atrial fibrillation who presented to the hospital due to shortness of breath.  1. COPD exacerbation-secondary to pneumonia. -Continue IV steroids, but will taper further as wheezing and bronchus spasm resolved. - Continue scheduled DuoNeb's, Pulmicort nebs, continue empiric IV antibiotics with ceftriaxone and Zithromax. - pt qualified for Home o2 which will be arranged prior to discharge.   2. Pneumonia/community-acquired pneumonia. -Continue IV ceftriaxone, Zithromax. Follow blood, sputum cultures.  3. Essential hypertension-continue losartan/HCTZ.  4. GERD-continue Protonix.  5. Depression-continue Celexa.  Physical therapy evaluation noted will discharge to SNF tomorrow.    All the records are reviewed and case discussed with Care Management/Social Worker. Management plans discussed with the patient, family and they are in  agreement.  CODE STATUS: DO NOT RESUSCITATE  DVT Prophylaxis: Lovenox  TOTAL TIME TAKING CARE OF THIS PATIENT:  25 minutes.   POSSIBLE D/C IN 1-2 DAYS, DEPENDING ON CLINICAL CONDITION.   Houston Siren M.D on 12/31/2016 at 3:48 PM  Between 7am to 6pm - Pager - [phone]  After 6pm go to [email or web address] - Social research officer, government  [facility] [place] [facility]  Office  [phone]  CC: Primary care physician; Danella Penton, MD

## 2016-12-31 NOTE — Progress Notes (Signed)
PT Cancellation Note  Patient Details Name: Michele Cooper MRN: [id number] DOB: [birth year]   Cancelled Treatment:    Reason Eval/Treat Not Completed: Other (comment) (Pt eating breakfast in bed. ) PT will re-attempt eval at a later time, when pt is available.    Norvil Martensen L 12/31/2016, 9:19 AM  Zerita Boers, PT,DPT 12/31/16 9:19 AM

## 2016-12-31 NOTE — Clinical Social Work Note (Signed)
Clinical Social Work Assessment  Patient Details  Name: Michele Cooper MRN: [id number] Date of Birth: 1916-05-04  Date of referral:  12/31/16               Reason for consult:  Facility Placement                Permission sought to share information with:  Chartered certified accountant granted to share information::  Yes, Verbal Permission Granted  Name::      Retail buyer::   [place]   Relationship::     Contact Information:     Housing/Transportation Living arrangements for the past 2 months:  Charity fundraiser of Information:  Patient, Adult Children Patient Interpreter Needed:  None Criminal Activity/Legal Involvement Pertinent to Current Situation/Hospitalization:  No - Comment as needed Significant Relationships:  Adult Children Lives with:  Self Do you feel safe going back to the place where you live?  Yes Need for family participation in patient care:  Yes (Comment)  Care giving concerns:  Patient is a resident at [facility] independent living and has home care providers that provide care 24/7.    Social Worker assessment / plan:  Holiday representative (CSW) received verbal consult from PT that recommendation is SNF. CSW met with patient and her son Shanon Brow [phone] was at bedside. Patient was alert and oriented X4. CSW introduced self and explained role of CSW department. Per son patient has 24/7 care in her apartment at [facility] independent living. Per son patient is on the waiting list for a large private room on the long term care side at [facility]. Per son patient is not on hospice care. CSW explained that patient can go to [facility] for rehab and then decide after rehab if patient needs to stay long term care SNF. CSW explained that patient's medicare requires a 3 night qualifying inpatient stay at a hospital in order to pay for SNF. Patient was admitted to inpatient on 12/29/16. Patient and son are  agreeable for patient to go to [facility] for rehab. Per Seth Bake admissions coordinator at [facility] patient can come to [facility] for rehab. CSW will continue to follow and assist as needed.   Employment status:  Retired Forensic scientist:  Medicare PT Recommendations:  [place] / Referral to community resources:  [place]  Patient/Family's Response to care:  Patient and son are agreeable for patient to go to [facility] for rehab.   Patient/Family's Understanding of and Emotional Response to Diagnosis, Current Treatment, and Prognosis:  Patient and son were very pleasant and thanked CSW for assistance.   Emotional Assessment Appearance:  Appears stated age Attitude/Demeanor/Rapport:    Affect (typically observed):  Accepting, Adaptable, Pleasant Orientation:  Oriented to Self, Oriented to  Time, Oriented to Place, Oriented to Situation Alcohol / Substance use:  Not Applicable Psych involvement (Current and /or in the community):  No (Comment)  Discharge Needs  Concerns to be addressed:  Discharge Planning Concerns Readmission within the last 30 days:  No Current discharge risk:  Dependent with Mobility, Chronically ill Barriers to Discharge:  Continued Medical Work up   [employer], Veronia Beets, LCSW 12/31/2016, 4:12 PM

## 2016-12-31 NOTE — [place] FL2 (Signed)
[place] MEDICAID FL2 LEVEL OF CARE SCREENING TOOL     IDENTIFICATION  Patient Name: Michele Cooper Birthdate: July 19, 1916 Sex: female Admission Date (Current Location): 12/29/2016  [place] and [place] Number:  Chiropodist and Address:  [facility], [address] [place], [place] [address]      Provider Number: [id number]  Attending Physician Name and Address:  Houston Siren, MD  Relative Name and Phone Number:       Current Level of Care: Hospital Recommended Level of Care: Skilled Nursing Facility Prior Approval Number:    Date Approved/Denied:   PASRR Number:  [id number] )  Discharge Plan: SNF    Current Diagnoses: Patient Active Problem List   Diagnosis Date Noted  . COPD with acute exacerbation (HCC) 12/29/2016  . Effusion of left hip 11/04/2016  . Leukocytosis 11/04/2016  . Anxiety 11/04/2016  . HTN (hypertension) 11/04/2016  . Pressure injury of skin 11/04/2016    Orientation RESPIRATION BLADDER Height & Weight     Self, Time, Situation, Place  O2 (2 Liters Oxygen ) Incontinent Weight: 130 lb (59 kg) Height:   (147.3 cm)  BEHAVIORAL SYMPTOMS/MOOD NEUROLOGICAL BOWEL NUTRITION STATUS   (none)  (none) Continent Diet (Diet: Heart Healthy )  AMBULATORY STATUS COMMUNICATION OF NEEDS Skin   Extensive Assist Verbally Normal                       Personal Care Assistance Level of Assistance  Bathing, Feeding, Dressing Bathing Assistance: Limited assistance Feeding assistance: Independent Dressing Assistance: Limited assistance     Functional Limitations Info  Sight, Hearing, Speech Sight Info: Adequate Hearing Info: Impaired Speech Info: Adequate    SPECIAL CARE FACTORS FREQUENCY  PT (By licensed PT), OT (By licensed OT)     PT Frequency:  (5) OT Frequency:  (5)            Contractures      Additional Factors Info  Code Status, Allergies Code Status Info:  (DNR ) Allergies Info:  (Sulfa  Antibiotics)           Current Medications (12/31/2016):  This is the current hospital active medication list Current Facility-Administered Medications  Medication Dose Route Frequency Provider Last Rate Last Dose  . 0.9 %  sodium chloride infusion  250 mL Intravenous PRN Auburn Bilberry, MD      . acetaminophen (TYLENOL) tablet 650 mg  650 mg Oral Q6H PRN Auburn Bilberry, MD       Or  . acetaminophen (TYLENOL) suppository 650 mg  650 mg Rectal Q6H PRN Auburn Bilberry, MD      . aspirin EC tablet 81 mg  81 mg Oral Daily Auburn Bilberry, MD   81 mg at 12/31/16 0912  . azithromycin (ZITHROMAX) 500 mg in dextrose 5 % 250 mL IVPB  500 mg Intravenous Q24H Auburn Bilberry, MD   Stopped at 12/30/16 1948  . budesonide (PULMICORT) nebulizer solution 0.25 mg  0.25 mg Nebulization BID Auburn Bilberry, MD   0.25 mg at 12/31/16 0802  . cefTRIAXone (ROCEPHIN) 1 g in dextrose 5 % 50 mL IVPB  1 g Intravenous Q24H Auburn Bilberry, MD   Stopped at 12/30/16 1702  . cholecalciferol (VITAMIN D) tablet 1,000 Units  1,000 Units Oral Daily Auburn Bilberry, MD   1,000 Units at 12/31/16 0912  . citalopram (CELEXA) tablet 20 mg  20 mg Oral Daily Auburn Bilberry, MD   20 mg at 12/31/16 0912  .  enoxaparin (LOVENOX) injection 30 mg  30 mg Subcutaneous Q24H Auburn Bilberry, MD   30 mg at 12/30/16 2124  . fluticasone (FLONASE) 50 MCG/ACT nasal spray 1 spray  1 spray Each Nare Daily PRN Auburn Bilberry, MD      . losartan (COZAAR) tablet 100 mg  100 mg Oral Daily Sheema M Hallaji, RPH   100 mg at 12/31/16 0912   And  . hydrochlorothiazide (MICROZIDE) capsule 12.5 mg  12.5 mg Oral Daily Sheema M Hallaji, RPH   12.5 mg at 12/31/16 0913  . hydrOXYzine (ATARAX/VISTARIL) tablet 25 mg  25 mg Oral Q8H PRN Auburn Bilberry, MD      . hyoscyamine (LEVSIN SL) SL tablet 0.125 mg  0.125 mg Sublingual Q4H PRN Auburn Bilberry, MD      . ipratropium-albuterol (DUONEB) 0.5-2.5 (3) MG/3ML nebulizer solution 3 mL  3 mL Nebulization TID Houston Siren, MD    3 mL at 12/31/16 0802  . ipratropium-albuterol (DUONEB) 0.5-2.5 (3) MG/3ML nebulizer solution 3 mL  3 mL Nebulization Q6H PRN Houston Siren, MD      . loperamide (IMODIUM) capsule 2 mg  2 mg Oral Q2H PRN Auburn Bilberry, MD      . methylPREDNISolone sodium succinate (SOLU-MEDROL) 125 mg/2 mL injection 60 mg  60 mg Intravenous Q12H Houston Siren, MD   60 mg at 12/31/16 0913  . montelukast (SINGULAIR) tablet 10 mg  10 mg Oral QHS Auburn Bilberry, MD   10 mg at 12/30/16 2124  . multivitamin with minerals tablet 1 tablet  1 tablet Oral Daily Auburn Bilberry, MD   1 tablet at 12/31/16 0912  . ondansetron (ZOFRAN) tablet 4 mg  4 mg Oral Q6H PRN Auburn Bilberry, MD       Or  . ondansetron (ZOFRAN) injection 4 mg  4 mg Intravenous Q6H PRN Auburn Bilberry, MD      . ondansetron (ZOFRAN) tablet 4 mg  4 mg Oral Q6H PRN Auburn Bilberry, MD      . pantoprazole (PROTONIX) EC tablet 40 mg  40 mg Oral BID AC Auburn Bilberry, MD   40 mg at 12/31/16 0912  . sodium chloride flush (NS) 0.9 % injection 3 mL  3 mL Intravenous Q12H Auburn Bilberry, MD   3 mL at 12/31/16 0913  . sodium chloride flush (NS) 0.9 % injection 3 mL  3 mL Intravenous PRN Auburn Bilberry, MD      . traMADol Janean Sark) tablet 50 mg  50 mg Oral Q6H PRN Auburn Bilberry, MD   50 mg at 12/31/16 0116  . traMADol-acetaminophen (ULTRACET) 37.5-325 MG per tablet 1 tablet  1 tablet Oral BID Auburn Bilberry, MD   1 tablet at 12/31/16 1610     Discharge Medications: Please see discharge summary for a list of discharge medications.  Relevant Imaging Results:  Relevant Lab Results:   Additional Information  (SSN: [id number])  Izek Corvino, Darleen Crocker, LCSW

## 2016-12-31 NOTE — Progress Notes (Signed)
Pt instructed in use of flutter valve to assist in mobilizing secretions.  She demonstrated proper technique.

## 2016-12-31 NOTE — Patient Care Conference (Deleted)
Called Dialysis to alert nurse, catheter is being placed at this time at bedside, stated she will alert on call nurse.

## 2016-12-31 NOTE — Plan of Care (Signed)
Problem: Respiratory: Goal: Respiratory status will improve Outcome: Not Progressing [place] during ambulation

## 2017-01-01 MED ORDER — LEVOFLOXACIN 500 MG PO TABS: 500.0000 mg | ORAL_TABLET | Freq: Every day | ORAL | Status: AC

## 2017-01-01 MED ORDER — TRAMADOL-ACETAMINOPHEN 37.5-325 MG PO TABS: 1.0000 | ORAL_TABLET | Freq: Two times a day (BID) | ORAL | 0 refills | Status: AC

## 2017-01-01 MED ORDER — PREDNISONE 10 MG PO TABS: ORAL_TABLET | ORAL | Status: AC

## 2017-01-01 NOTE — Progress Notes (Signed)
Patient is medically stable for discharge today to [facility]. Per Seth Bake admissions coordinator at [facility], patient can come to room 216. Social work Patent examiner discharge orders in [place]. Social work Theatre manager met with patient at bedside and explained that patient will discharge today to [facility]. Patient verbally agreed she understood. Social work Theatre manager spoke to patient's son Shanon Brow making him aware of patient's discharge today. RN will call report and arrange EMS for transport. Please re-consult if future social work needs arise. Social work Architectural technologist off.   [name]. Social Work Intern  [phone]

## 2017-01-01 NOTE — Discharge Summary (Signed)
[facility] [place] at [facility]   PATIENT NAME: Michele Cooper    MR#:  [id number]  DATE OF BIRTH:  [birth year]  DATE OF ADMISSION:  12/29/2016 ADMITTING PHYSICIAN: Auburn Bilberry, MD  DATE OF DISCHARGE: 01/01/2017  PRIMARY CARE PHYSICIAN: Danella Penton, MD    ADMISSION DIAGNOSIS:  SOB (shortness of breath) [R06.02] COPD exacerbation (HCC) [J44.1]  DISCHARGE DIAGNOSIS:  Active Problems:   COPD with acute exacerbation (HCC)   SECONDARY DIAGNOSIS:   Past Medical History:  Diagnosis Date  . A-fib (HCC)   . Anxiety   . Arthritis   . Gastritis   . Hypertension   . Reactive airway disease     HOSPITAL COURSE:   83 year old female with past medical history of COPD, hypertension, osteoarthritis, anxiety, atrial fibrillation who presented to the hospital due to shortness of breath.  1. COPD exacerbation- this was secondary to pneumonia. - pt. Was treated with IV Steroids, scheduled duonebs, Pulmicort nebs and empiric abx with Ceftriaxone and Zithromax.  - pt's wheezing, shortness of breath much improved and now being discharged on Oral Prednisone taper and Levaquin.  - pt. Did qualify for O2 and is being discharged on that.   2. Pneumonia/community-acquired pneumonia. - pt. Was treated with IV Ceftriaxone, Zithromax while in hospital and has improved and now being discharged on Oral Levaquin.   3. Essential hypertension- pt. Will continue losartan/HCTZ and Propranolol.   4. GERD- she will continue Protonix.  5. Depression- She will continue Celexa.  DISCHARGE CONDITIONS:   Stable.   CONSULTS OBTAINED:    DRUG ALLERGIES:   Allergies  Allergen Reactions  . Sulfa Antibiotics Hives    DISCHARGE MEDICATIONS:   Allergies as of 01/01/2017      Reactions   Sulfa Antibiotics Hives      Medication List    STOP taking these medications   amoxicillin-clavulanate 875-125 MG tablet Commonly known as:  AUGMENTIN   azithromycin 250 MG  tablet Commonly known as:  ZITHROMAX   ondansetron 4 MG tablet Commonly known as:  ZOFRAN   traMADol 50 MG tablet Commonly known as:  ULTRAM     TAKE these medications   acetaminophen 325 MG tablet Commonly known as:  TYLENOL Take 2 tablets (650 mg total) by mouth every 6 (six) hours as needed for mild pain (or Fever >/= 101). What changed:  when to take this   aspirin EC 81 MG tablet Take 81 mg by mouth daily.   budesonide 0.5 MG/2ML nebulizer solution Commonly known as:  PULMICORT Take 2 mLs (0.5 mg total) by nebulization 2 (two) times daily.   cholecalciferol 1000 units tablet Commonly known as:  VITAMIN D Take 1,000 Units by mouth daily.   citalopram 20 MG tablet Commonly known as:  CELEXA Take 20 mg by mouth daily.   fluticasone 50 MCG/ACT nasal spray Commonly known as:  FLONASE Place 1 spray into both nostrils daily as needed.   hydrOXYzine 25 MG tablet Commonly known as:  ATARAX/VISTARIL Take 25 mg by mouth every 8 (eight) hours as needed for itching.   hyoscyamine 0.125 MG SL tablet Commonly known as:  LEVSIN SL Place 0.125 mg under the tongue every 4 (four) hours as needed for cramping.   ipratropium-albuterol 0.5-2.5 (3) MG/3ML Soln Commonly known as:  DUONEB Take 3 mLs by nebulization every 6 (six) hours.   levofloxacin 500 MG tablet Commonly known as:  LEVAQUIN Take 1 tablet (500 mg total) by mouth daily.   loperamide 2  MG capsule Commonly known as:  IMODIUM Take 1 capsule (2 mg total) by mouth every 2 (two) hours as needed for diarrhea or loose stools (do NOT exceed  per day).   losartan-hydrochlorothiazide 100-12.5 MG tablet Commonly known as:  HYZAAR Take 1 tablet by mouth daily.   montelukast 10 MG tablet Commonly known as:  SINGULAIR Take 10 mg by mouth at bedtime.   multivitamin with minerals Tabs tablet Take 1 tablet by mouth daily.   pantoprazole 40 MG tablet Commonly known as:  PROTONIX Take 40 mg by mouth 2 (two) times  daily before a meal.   predniSONE 10 MG tablet Commonly known as:  DELTASONE Label  & dispense according to the schedule below. 5 Pills PO for 1 day then, 4 Pills PO for 1 day, 3 Pills PO for 1 day, 2 Pills PO for 1 day, 1 Pill PO for 1 days then STOP. What changed:  medication strength  how much to take  how to take this  when to take this  additional instructions   propranolol 20 MG tablet Commonly known as:  INDERAL Take 20 mg by mouth daily as needed.   traMADol-acetaminophen 37.5-325 MG tablet Commonly known as:  ULTRACET Take 1 tablet by mouth 2 (two) times daily.         DISCHARGE INSTRUCTIONS:   DIET:  Cardiac diet  DISCHARGE CONDITION:  Stable  ACTIVITY:  Activity as tolerated  OXYGEN:  Home Oxygen: Yes.     Oxygen Delivery: 2 liters/min via Patient connected to nasal cannula oxygen  DISCHARGE LOCATION:  nursing home   If you experience worsening of your admission symptoms, develop shortness of breath, life threatening emergency, suicidal or homicidal thoughts you must seek medical attention immediately by calling 911 or calling your MD immediately  if symptoms less severe.  You Must read complete instructions/literature along with all the possible adverse reactions/side effects for all the Medicines you take and that have been prescribed to you. Take any new Medicines after you have completely understood and accpet all the possible adverse reactions/side effects.   Please note  You were cared for by a hospitalist during your hospital stay. If you have any questions about your discharge medications or the care you received while you were in the hospital after you are discharged, you can call the unit and asked to speak with the hospitalist on call if the hospitalist that took care of you is not available. Once you are discharged, your primary care physician will handle any further medical issues. Please note that NO REFILLS for any discharge medications  will be authorized once you are discharged, as it is imperative that you return to your primary care physician (or establish a relationship with a primary care physician if you do not have one) for your aftercare needs so that they can reassess your need for medications and monitor your lab values.     Today   Shortness of breath improved.  No other acute complaints or events overnight.   VITAL SIGNS:  Blood pressure (!) 169/79, pulse 85, temperature 98.8 F (37.1 C), temperature source Oral, resp. rate 16, height  (1.473 m), weight 59 kg (130 lb), SpO2 99 %.  I/O:   Intake/Output Summary (Last 24 hours) at 01/01/17 1031 Last data filed at 12/31/16 2124  Gross per 24 hour  Intake              660 ml  Output  0 ml  Net              660 ml    PHYSICAL EXAMINATION:   GENERAL:  83 y.o.-year-old patient lying in bed in no acute distress.  EYES: Pupils equal, round, reactive to light. No scleral icterus. Extraocular muscles intact.  HEENT: Head atraumatic, normocephalic. Oropharynx and nasopharynx clear.  NECK:  Supple, no jugular venous distention. No thyroid enlargement, no tenderness.  LUNGS: Prolonged Insp. & Exp. Phase, no wheezing, rales, rhonchi. No use of accessory muscles of respiration.  CARDIOVASCULAR: S1, S2 normal. No murmurs, rubs, or gallops.  ABDOMEN: Soft, nontender, nondistended. Bowel sounds present. No organomegaly or mass.  EXTREMITIES: No cyanosis, clubbing or edema b/l.    NEUROLOGIC: Cranial nerves II through XII are intact. No focal Motor or sensory deficits b/l. Globally weak.    PSYCHIATRIC: The patient is alert and oriented x 3.  SKIN: No obvious rash, lesion, or ulcer.   DATA REVIEW:   CBC  Recent Labs Lab 12/31/16 0414  WBC 12.4*  HGB 8.0*  HCT 24.9*  PLT 354    Chemistries   Recent Labs Lab 12/30/16 0318  NA 139  K 3.9  CL 104  CO2 27  GLUCOSE 142*  BUN 17  CREATININE 0.80  CALCIUM 8.6*    Cardiac  Enzymes  Recent Labs Lab 12/29/16 1236  TROPONINI <0.03    RADIOLOGY:  No results found.    Management plans discussed with the patient, family and they are in agreement.  CODE STATUS:     Code Status Orders        Start     Ordered   12/29/16 1624  Do not attempt resuscitation (DNR)  Continuous    Question Answer Comment  In the event of cardiac or respiratory ARREST Do not call a "code blue"   In the event of cardiac or respiratory ARREST Do not perform Intubation, CPR, defibrillation or ACLS   In the event of cardiac or respiratory ARREST Use medication by any route, position, wound care, and other measures to relive pain and suffering. May use oxygen, suction and manual treatment of airway obstruction as needed for comfort.      12/29/16 1623    Code Status History    Date Active Date Inactive Code Status Order ID Comments User Context   11/04/2016  1:19 AM 11/06/2016  9:11 PM DNR [id number]  Oralia Manis, MD Inpatient    Advance Directive Documentation     Most Recent Value  Type of Advance Directive  Healthcare Power of Attorney, Living will  Pre-existing out of facility DNR order (yellow form or pink MOST form)  -  "MOST" Form in Place?  -      TOTAL TIME TAKING CARE OF THIS PATIENT: 40 minutes.    Houston Siren M.D on 01/01/2017 at 10:31 AM  Between 7am to 6pm - Pager - [phone]  After 6pm go to [email or web address] - Social research officer, government  [facility] [place] [facility]  Office  [phone]  CC: Primary care physician; Danella Penton, MD

## 2017-01-01 NOTE — Care Management Important Message (Signed)
Important Message  Patient Details  Name: TOMA ERICHSEN MRN: [id number] Date of Birth: 1915-09-28   Medicare Important Message Given:  Yes    Marily Memos, RN 01/01/2017, 9:58 AM

## 2017-01-01 NOTE — Progress Notes (Signed)
Rept called to Environmental education officer at [facility]. No change in patient from AM assessment. Pt pain controlled. NSL d/ced for discharge. Non emergency transport called. Pt personal sitter at bedside. Pt aware of plan of care. Will continue to monitor.

## 2017-01-01 NOTE — Progress Notes (Signed)
Pt discharged to [facility] per MD order without incident via stretcher. No change in pt from AM assessment. Pt denies pain. Family aware of transfer.

## 2017-01-01 NOTE — Clinical Social Work Placement (Signed)
   CLINICAL SOCIAL WORK PLACEMENT  NOTE  Date:  01/01/2017  Patient Details  Name: Michele Cooper MRN: [id number] Date of Birth: 02-Oct-1915  Clinical Social Work is seeking post-discharge placement for this patient at the Skilled  Nursing Facility level of care (*CSW will initial, date and re-position this form in  chart as items are completed):  Yes   Patient/family provided with [facility] Clinical Social Work Department's list of facilities offering this level of care within the geographic area requested by the patient (or if unable, by the patient's family).  Yes   Patient/family informed of their freedom to choose among providers that offer the needed level of care, that participate in Medicare, Medicaid or managed care program needed by the patient, have an available bed and are willing to accept the patient.  Yes   Patient/family informed of [facility]'s ownership interest in [facility] and [facility], as well as of the fact that they are under no obligation to receive care at these facilities.  PASRR submitted to EDS on       PASRR number received on       Existing PASRR number confirmed on 12/31/16     FL2 transmitted to all facilities in geographic area requested by pt/family on 12/31/16     FL2 transmitted to all facilities within larger geographic area on       Patient informed that his/her managed care company has contracts with or will negotiate with certain facilities, including the following:        Yes   Patient/family informed of bed offers received.  Patient chooses bed at  [facility]) )     Physician recommends and patient chooses bed at      Patient to be transferred to  [facility]) on 01/01/17.  Patient to be transferred to facility by  [facility])     Patient family notified on 01/01/17 of transfer.  Name of family member notified:   (Patient's son Onalee Hua was notified of patient's discharge to [facility] today. )      PHYSICIAN       Additional Comment:    _______________________________________________ Ralene Bathe, Student-Social Work 01/01/2017, 12:15 PM

## 2017-01-03 DIAGNOSIS — M159 Polyosteoarthritis, unspecified: Secondary | ICD-10-CM

## 2017-01-03 DIAGNOSIS — J9611 Chronic respiratory failure with hypoxia: Secondary | ICD-10-CM | POA: Diagnosis not present

## 2017-01-03 DIAGNOSIS — J181 Lobar pneumonia, unspecified organism: Secondary | ICD-10-CM | POA: Diagnosis not present

## 2017-01-03 DIAGNOSIS — K219 Gastro-esophageal reflux disease without esophagitis: Secondary | ICD-10-CM | POA: Diagnosis not present

## 2017-01-03 DIAGNOSIS — F39 Unspecified mood [affective] disorder: Secondary | ICD-10-CM

## 2017-01-03 DIAGNOSIS — J441 Chronic obstructive pulmonary disease with (acute) exacerbation: Secondary | ICD-10-CM | POA: Diagnosis not present

## 2017-01-03 DIAGNOSIS — I10 Essential (primary) hypertension: Secondary | ICD-10-CM | POA: Diagnosis not present

## 2017-01-24 DIAGNOSIS — F39 Unspecified mood [affective] disorder: Secondary | ICD-10-CM

## 2017-01-24 DIAGNOSIS — K219 Gastro-esophageal reflux disease without esophagitis: Secondary | ICD-10-CM

## 2017-01-24 DIAGNOSIS — J9611 Chronic respiratory failure with hypoxia: Secondary | ICD-10-CM

## 2017-01-24 DIAGNOSIS — J439 Emphysema, unspecified: Secondary | ICD-10-CM | POA: Diagnosis not present

## 2017-01-24 DIAGNOSIS — I10 Essential (primary) hypertension: Secondary | ICD-10-CM | POA: Diagnosis not present

## 2017-01-24 DIAGNOSIS — M159 Polyosteoarthritis, unspecified: Secondary | ICD-10-CM

## 2017-02-22 DIAGNOSIS — M1990 Unspecified osteoarthritis, unspecified site: Secondary | ICD-10-CM | POA: Diagnosis not present

## 2017-02-22 DIAGNOSIS — J449 Chronic obstructive pulmonary disease, unspecified: Secondary | ICD-10-CM | POA: Diagnosis not present

## 2017-02-22 DIAGNOSIS — J9611 Chronic respiratory failure with hypoxia: Secondary | ICD-10-CM

## 2017-02-22 DIAGNOSIS — K219 Gastro-esophageal reflux disease without esophagitis: Secondary | ICD-10-CM | POA: Diagnosis not present

## 2017-02-22 DIAGNOSIS — I10 Essential (primary) hypertension: Secondary | ICD-10-CM | POA: Diagnosis not present

## 2017-02-22 DIAGNOSIS — F39 Unspecified mood [affective] disorder: Secondary | ICD-10-CM

## 2017-04-08 DIAGNOSIS — F39 Unspecified mood [affective] disorder: Secondary | ICD-10-CM | POA: Diagnosis not present

## 2017-04-08 DIAGNOSIS — I10 Essential (primary) hypertension: Secondary | ICD-10-CM | POA: Diagnosis not present

## 2017-04-08 DIAGNOSIS — J439 Emphysema, unspecified: Secondary | ICD-10-CM

## 2017-04-08 DIAGNOSIS — K219 Gastro-esophageal reflux disease without esophagitis: Secondary | ICD-10-CM

## 2017-04-08 DIAGNOSIS — J9611 Chronic respiratory failure with hypoxia: Secondary | ICD-10-CM

## 2017-05-31 DIAGNOSIS — F39 Unspecified mood [affective] disorder: Secondary | ICD-10-CM | POA: Diagnosis not present

## 2017-05-31 DIAGNOSIS — I10 Essential (primary) hypertension: Secondary | ICD-10-CM | POA: Diagnosis not present

## 2017-05-31 DIAGNOSIS — J9610 Chronic respiratory failure, unspecified whether with hypoxia or hypercapnia: Secondary | ICD-10-CM

## 2017-05-31 DIAGNOSIS — J449 Chronic obstructive pulmonary disease, unspecified: Secondary | ICD-10-CM

## 2017-05-31 DIAGNOSIS — K219 Gastro-esophageal reflux disease without esophagitis: Secondary | ICD-10-CM

## 2017-08-05 DIAGNOSIS — K219 Gastro-esophageal reflux disease without esophagitis: Secondary | ICD-10-CM

## 2017-08-05 DIAGNOSIS — F39 Unspecified mood [affective] disorder: Secondary | ICD-10-CM | POA: Diagnosis not present

## 2017-08-05 DIAGNOSIS — I10 Essential (primary) hypertension: Secondary | ICD-10-CM | POA: Diagnosis not present

## 2017-08-05 DIAGNOSIS — J439 Emphysema, unspecified: Secondary | ICD-10-CM

## 2017-08-05 DIAGNOSIS — J9611 Chronic respiratory failure with hypoxia: Secondary | ICD-10-CM

## 2017-10-02 DIAGNOSIS — F39 Unspecified mood [affective] disorder: Secondary | ICD-10-CM | POA: Diagnosis not present

## 2017-10-02 DIAGNOSIS — R251 Tremor, unspecified: Secondary | ICD-10-CM | POA: Diagnosis not present

## 2017-10-02 DIAGNOSIS — J069 Acute upper respiratory infection, unspecified: Secondary | ICD-10-CM | POA: Diagnosis not present

## 2017-10-02 DIAGNOSIS — J302 Other seasonal allergic rhinitis: Secondary | ICD-10-CM | POA: Diagnosis not present

## 2017-10-02 DIAGNOSIS — I10 Essential (primary) hypertension: Secondary | ICD-10-CM | POA: Diagnosis not present

## 2017-10-02 DIAGNOSIS — M1990 Unspecified osteoarthritis, unspecified site: Secondary | ICD-10-CM | POA: Diagnosis not present

## 2017-10-02 DIAGNOSIS — J449 Chronic obstructive pulmonary disease, unspecified: Secondary | ICD-10-CM

## 2017-10-02 DIAGNOSIS — K219 Gastro-esophageal reflux disease without esophagitis: Secondary | ICD-10-CM

## 2017-10-04 DIAGNOSIS — R05 Cough: Secondary | ICD-10-CM

## 2017-11-26 DIAGNOSIS — K219 Gastro-esophageal reflux disease without esophagitis: Secondary | ICD-10-CM | POA: Diagnosis not present

## 2017-11-26 DIAGNOSIS — F39 Unspecified mood [affective] disorder: Secondary | ICD-10-CM | POA: Diagnosis not present

## 2017-11-26 DIAGNOSIS — I10 Essential (primary) hypertension: Secondary | ICD-10-CM

## 2017-11-26 DIAGNOSIS — J441 Chronic obstructive pulmonary disease with (acute) exacerbation: Secondary | ICD-10-CM | POA: Diagnosis not present

## 2017-11-26 DIAGNOSIS — J9611 Chronic respiratory failure with hypoxia: Secondary | ICD-10-CM

## 2017-11-26 DIAGNOSIS — M159 Polyosteoarthritis, unspecified: Secondary | ICD-10-CM | POA: Diagnosis not present

## 2018-01-08 DEATH — deceased

## 2019-01-06 IMAGING — MR MR HIP*L* W/O CM
4 of 5 series · 23 of 40 positions shown · non-contrast
Comparison: Radiographs and CT scan dated 11/03/2016

CLINICAL DATA: Left hip pain. The patient is unable to bear weight.
No known trauma.

EXAM:
MR OF THE LEFT HIP WITHOUT CONTRAST
TECHNIQUE: Multiplanar, multisequence MR imaging was performed. No intravenous
contrast was administered.

[Series 3: T1 · coronal · 4.0mm · 0.78mm/px · 4 of 35 slices shown (1 of 2)]
[im 1/35]
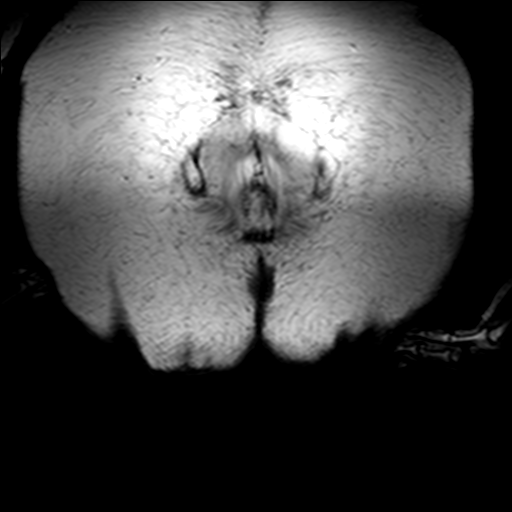
[im 6/35]
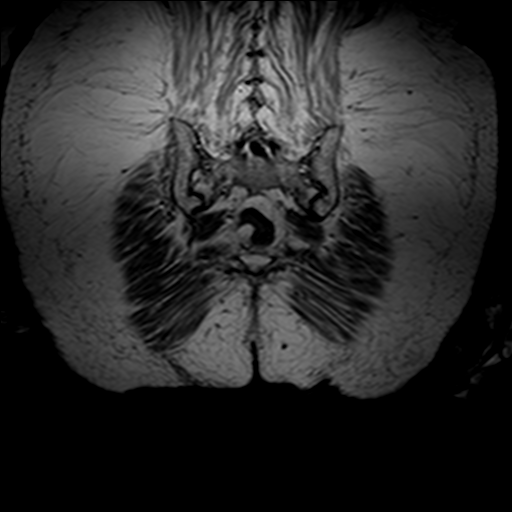
[im 18/35]
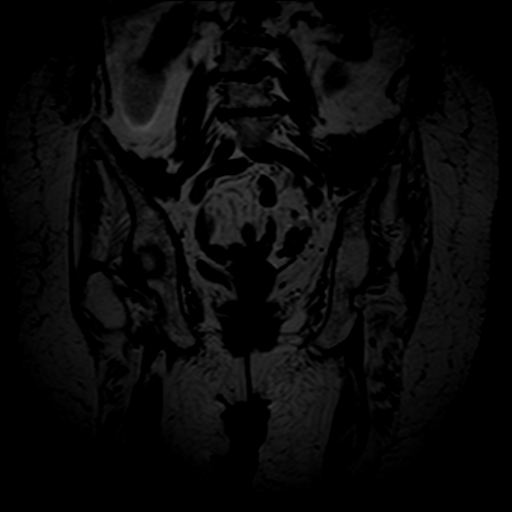
[im 29/35]
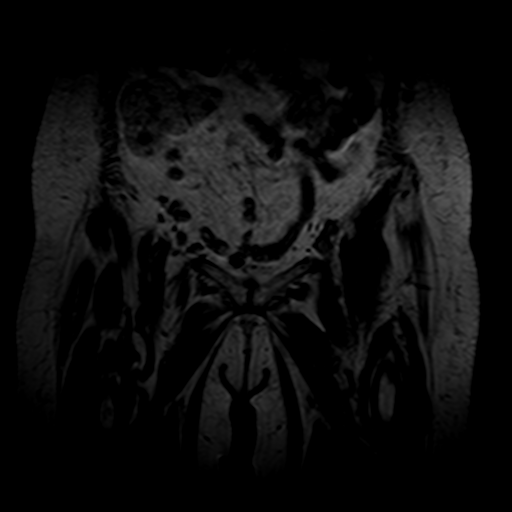

[Series 4: T1 · axial · 4.0mm · 0.78mm/px · z∈[-83,+67]mm · 3 of 42 slices shown (2 of 2)]
[im 6/42]
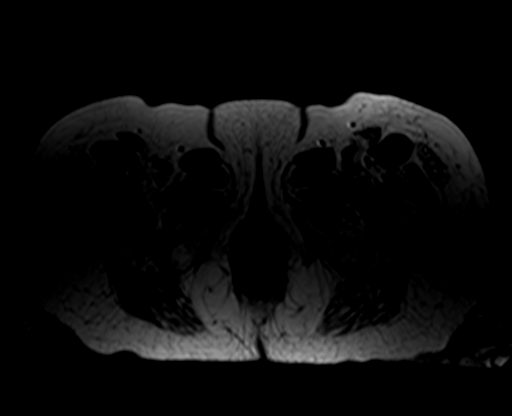
[im 21/42]
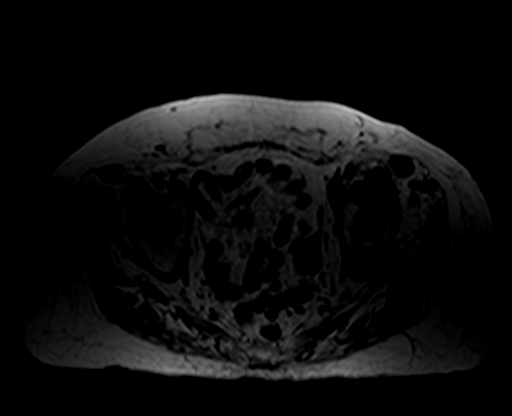
[im 36/42]
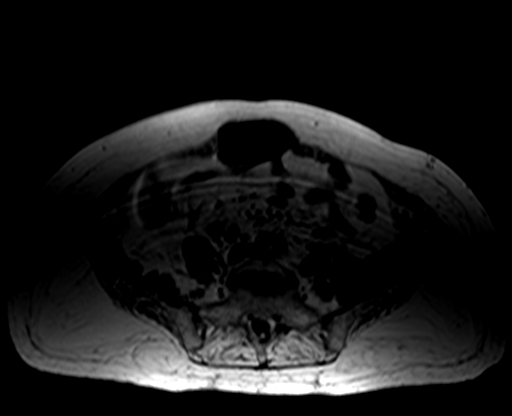

[Series 5: T2 fat-sat · axial · 4.0mm · 0.78mm/px · z∈[-108,+97]mm · 9 of 42 slices shown]
[im 1/42]
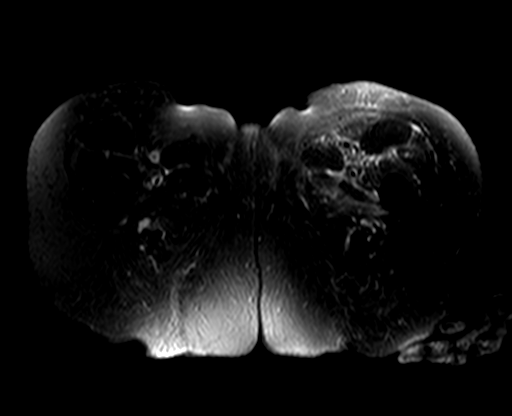
[im 6/42]
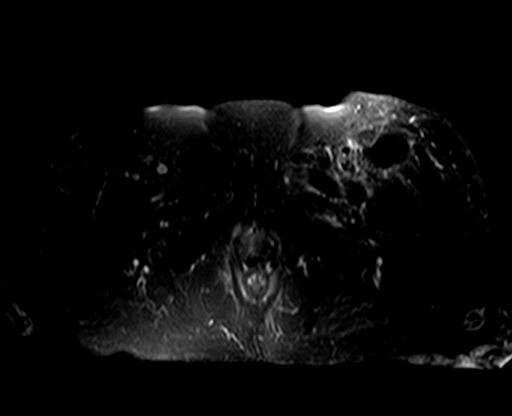
[im 11/42]
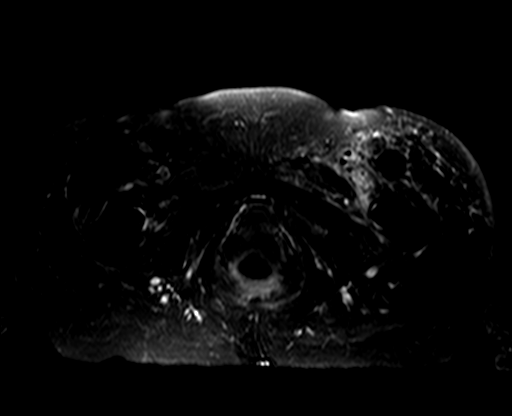
[im 16/42]
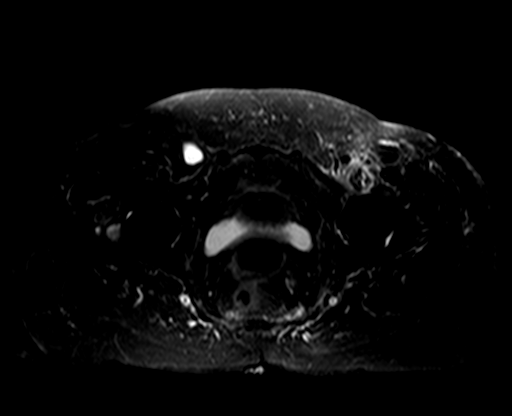
[im 21/42]
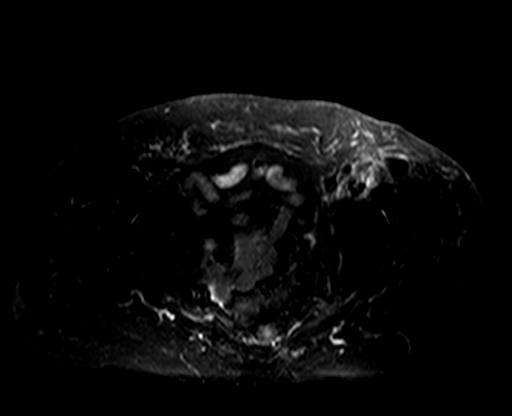
[im 26/42]
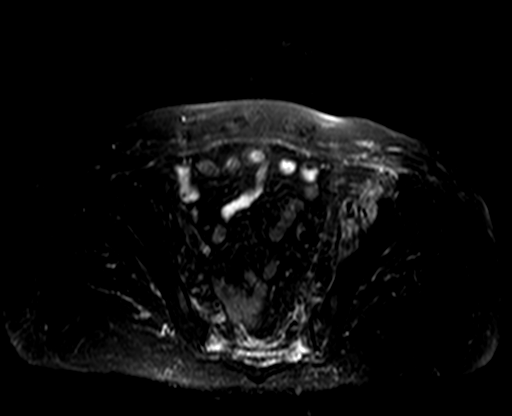
[im 31/42]
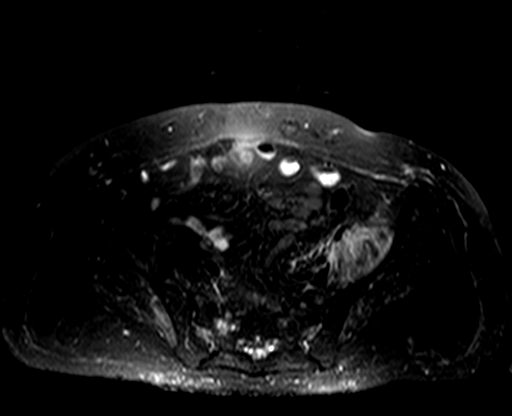
[im 36/42]
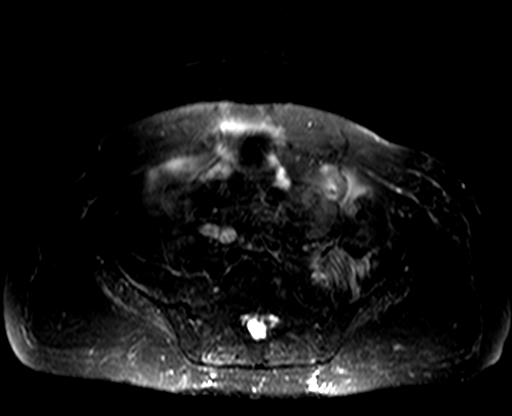
[im 42/42]
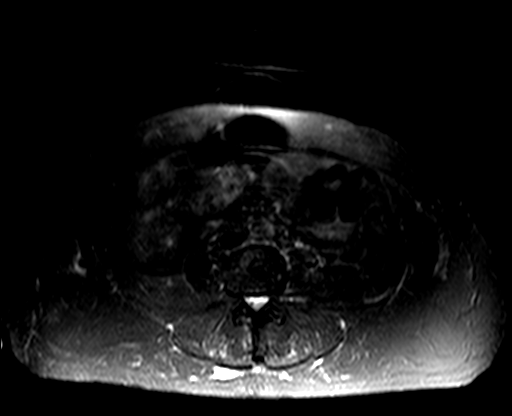

[Series 6: PD fat-sat · sagittal · 4.0mm · 0.35mm/px · 7 of 35 slices shown]
[im 1/35]
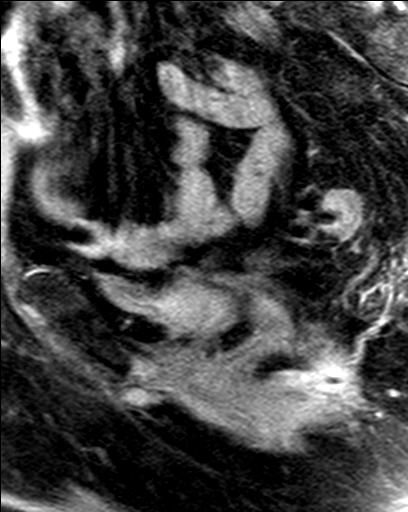
[im 6/35]
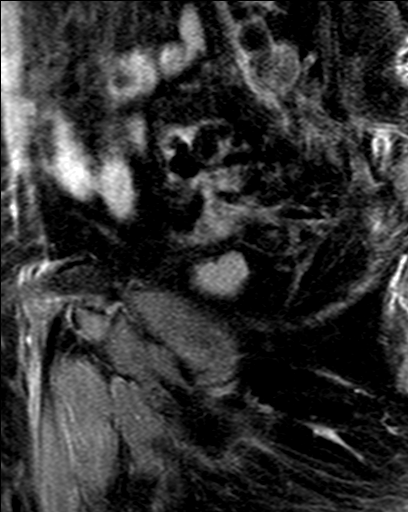
[im 12/35]
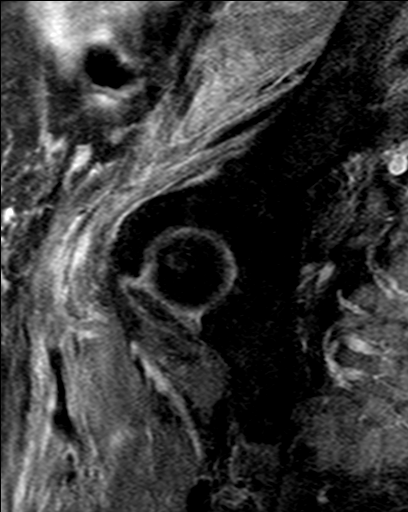
[im 18/35]
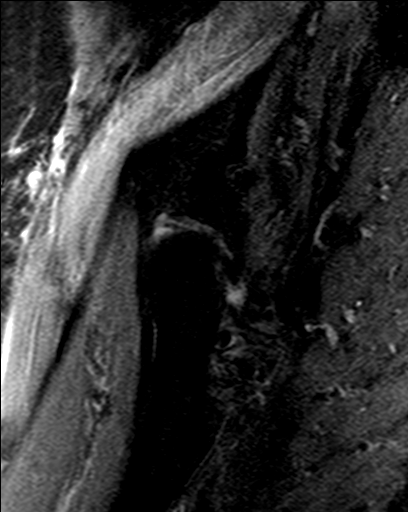
[im 23/35]
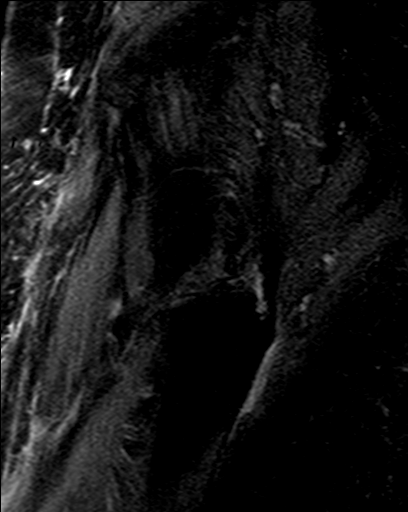
[im 29/35]
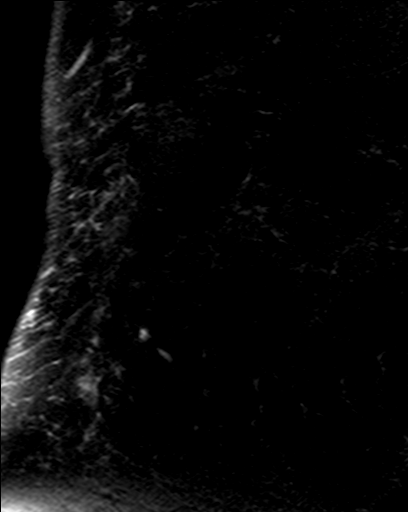
[im 35/35]
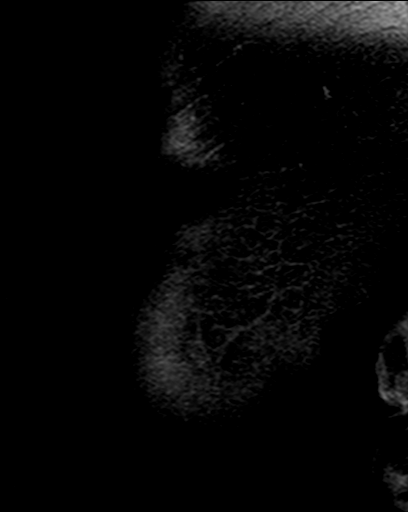

[23 of 40 positions shown; findings below may reference images not displayed]

FINDINGS: Bones: The patient has a transverse fracture through the third
sacral segment.

Muscles and tendons: There is a complete disruption of the iliopsoas
tendon at the level of the head of the left femur. There is edema
throughout distal iliopsoas muscle and surrounding the torn tendon.
The tendon is avulsed from its insertion on the lesser trochanter.

Articular cartilage and labrum

Articular cartilage:  The hip joint appears normal.

Labrum:  Normal.

Joint or bursal effusion

Joint effusion:  No joint effusion.

Bursae:  Normal.
IMPRESSION: 1. Transverse fracture through the third sacral segment.
2. Complete avulsion of the left iliopsoas tendon from its insertion
on the left greater trochanter with edema in the adjacent soft
tissues.
